# Patient Record
Sex: Male | Born: 1960
Health system: Southern US, Community
[De-identification: ages and names within clinical notes are randomized; demographics above are authoritative.]

## PROBLEM LIST (undated history)

## (undated) DIAGNOSIS — M810 Age-related osteoporosis without current pathological fracture: Secondary | ICD-10-CM

## (undated) DIAGNOSIS — E039 Hypothyroidism, unspecified: Secondary | ICD-10-CM

## (undated) DIAGNOSIS — E349 Endocrine disorder, unspecified: Secondary | ICD-10-CM

## (undated) DIAGNOSIS — E785 Hyperlipidemia, unspecified: Secondary | ICD-10-CM

## (undated) DIAGNOSIS — Z8639 Personal history of other endocrine, nutritional and metabolic disease: Secondary | ICD-10-CM

## (undated) DIAGNOSIS — E079 Disorder of thyroid, unspecified: Secondary | ICD-10-CM

## (undated) HISTORY — DX: Personal history of other endocrine, nutritional and metabolic disease: Z86.39

## (undated) HISTORY — PX: TONSILLECTOMY: SUR1361

## (undated) HISTORY — DX: Hyperlipidemia, unspecified: E78.5

## (undated) HISTORY — DX: Disorder of thyroid, unspecified: E07.9

## (undated) HISTORY — DX: Endocrine disorder, unspecified: E34.9

## (undated) HISTORY — DX: Age-related osteoporosis without current pathological fracture: M81.0

---

## 2001-11-01 ENCOUNTER — Ambulatory Visit (HOSPITAL_COMMUNITY): Admission: RE | Admit: 2001-11-01 | Discharge: 2001-11-01 | Payer: Self-pay | Admitting: Family Medicine

## 2001-11-01 ENCOUNTER — Encounter: Payer: Self-pay | Admitting: Family Medicine

## 2006-07-03 ENCOUNTER — Ambulatory Visit (HOSPITAL_COMMUNITY): Admission: RE | Admit: 2006-07-03 | Discharge: 2006-07-03 | Payer: Self-pay | Admitting: General Surgery

## 2006-07-03 LAB — HM COLONOSCOPY

## 2010-05-06 ENCOUNTER — Ambulatory Visit (HOSPITAL_COMMUNITY): Admission: RE | Admit: 2010-05-06 | Discharge: 2010-05-06 | Payer: Self-pay | Admitting: Family Medicine

## 2010-05-17 ENCOUNTER — Ambulatory Visit (HOSPITAL_COMMUNITY): Admission: RE | Admit: 2010-05-17 | Discharge: 2010-05-17 | Payer: Self-pay | Admitting: Family Medicine

## 2011-03-10 NOTE — H&P (Signed)
NAME:  Mason Andersen, Mason Andersen                ACCOUNT NO.:  [id number]   MEDICAL RECORD NO.:  [id number]            PATIENT TYPE:  AMB   LOCATION:                                FACILITY:  [facility]   PHYSICIAN:  Dalia Heading, M.D.  DATE OF BIRTH:  1960-12-08   DATE OF ADMISSION:  06/28/2006  DATE OF DISCHARGE:  LH                                HISTORY & PHYSICAL   CHIEF COMPLAINT:  Hematochezia.   HISTORY OF PRESENT ILLNESS:  The patient is a 50 year old white male who is  referred for endoscopic evaluation.  He needs colonoscopy for hematochezia.  He has been having intermittent hematochezia for many months.  He is  currently asymptomatic.  No abdominal pain, weight loss, nausea, vomiting,  diarrhea, constipation, or melena have been noted.  He has never had a  colonoscopy.  There is no family history of colon carcinoma.   PAST MEDICAL HISTORY:  Unremarkable.   PAST SURGICAL HISTORY:  Unremarkable.   CURRENT MEDICATIONS:  None.   ALLERGIES:  No known drug allergies.   REVIEW OF SYSTEMS:  Noncontributory.   PHYSICAL EXAMINATION:  GENERAL:  The patient is a well-developed, well-  nourished white male in no acute distress.  LUNGS:  Clear to auscultation with equal breath sounds bilaterally.  HEART:  Examination reveals a regular rate and rhythm without S3, S4, or  murmurs.  ABDOMEN:  Soft, nontender, and nondistended.  No hepatosplenomegaly or  masses are noted.  RECTAL:  Examination was deferred to the procedure.   IMPRESSION:  Hematochezia.   PLAN:  The patient is scheduled for a colonoscopy on June 28, 2006.  The  risks and benefits of the procedure including bleeding and perforation were  fully explained to the patient, who gave informed consent.      Dalia Heading, M.D.  Electronically Signed    MAJ/MEDQ  D:  06/14/2006  T:  06/14/2006  Job:  [id number]

## 2012-11-11 ENCOUNTER — Other Ambulatory Visit: Payer: Self-pay | Admitting: Family Medicine

## 2012-11-11 DIAGNOSIS — M810 Age-related osteoporosis without current pathological fracture: Secondary | ICD-10-CM

## 2012-11-14 ENCOUNTER — Ambulatory Visit (HOSPITAL_COMMUNITY)
Admission: RE | Admit: 2012-11-14 | Discharge: 2012-11-14 | Disposition: A | Discharge status: Home / Self Care | Payer: BC Managed Care – PPO | Source: Ambulatory Visit | Attending: Family Medicine | Admitting: Family Medicine

## 2012-11-14 DIAGNOSIS — M810 Age-related osteoporosis without current pathological fracture: Secondary | ICD-10-CM

## 2013-07-17 ENCOUNTER — Other Ambulatory Visit: Payer: Self-pay | Admitting: *Deleted

## 2013-09-15 ENCOUNTER — Other Ambulatory Visit: Payer: Self-pay | Admitting: Family Medicine

## 2013-09-15 DIAGNOSIS — R5381 Other malaise: Secondary | ICD-10-CM

## 2013-09-15 DIAGNOSIS — Z125 Encounter for screening for malignant neoplasm of prostate: Secondary | ICD-10-CM

## 2013-09-15 DIAGNOSIS — Z0000 Encounter for general adult medical examination without abnormal findings: Secondary | ICD-10-CM

## 2013-09-16 ENCOUNTER — Other Ambulatory Visit: Payer: Self-pay | Admitting: *Deleted

## 2013-09-16 DIAGNOSIS — Z125 Encounter for screening for malignant neoplasm of prostate: Secondary | ICD-10-CM

## 2013-09-16 DIAGNOSIS — Z0000 Encounter for general adult medical examination without abnormal findings: Secondary | ICD-10-CM

## 2013-09-16 DIAGNOSIS — R5381 Other malaise: Secondary | ICD-10-CM

## 2013-09-16 DIAGNOSIS — Z79899 Other long term (current) drug therapy: Secondary | ICD-10-CM

## 2013-09-16 NOTE — Telephone Encounter (Signed)
Not seen since epic

## 2013-09-16 NOTE — Telephone Encounter (Signed)
Ok times one liv met 7 lip psa and testosterone and ov before next rx

## 2013-09-16 NOTE — Telephone Encounter (Signed)
i need chart

## 2013-10-15 ENCOUNTER — Other Ambulatory Visit: Payer: Self-pay | Admitting: Family Medicine

## 2013-10-27 ENCOUNTER — Telehealth: Payer: Self-pay | Admitting: Family Medicine

## 2013-10-27 DIAGNOSIS — M25572 Pain in left ankle and joints of left foot: Secondary | ICD-10-CM

## 2013-10-27 NOTE — Telephone Encounter (Signed)
Patient notified via VM

## 2013-10-27 NOTE — Telephone Encounter (Signed)
Ok do x ray

## 2013-10-27 NOTE — Telephone Encounter (Signed)
Patient would like an xray ordered for his left heel because it has been in pain lately. He is hoping to be able to discuss this with you at this physical coming up.

## 2013-11-03 ENCOUNTER — Ambulatory Visit (HOSPITAL_COMMUNITY)
Admission: RE | Admit: 2013-11-03 | Discharge: 2013-11-03 | Disposition: A | Discharge status: Home / Self Care | Payer: BC Managed Care – PPO | Source: Ambulatory Visit | Attending: Family Medicine | Admitting: Family Medicine

## 2013-11-03 DIAGNOSIS — M79609 Pain in unspecified limb: Secondary | ICD-10-CM | POA: Insufficient documentation

## 2013-11-03 DIAGNOSIS — M25572 Pain in left ankle and joints of left foot: Secondary | ICD-10-CM

## 2013-11-03 LAB — BASIC METABOLIC PANEL
BUN: 16 mg/dL (ref 6–23)
CO2: 30 mEq/L (ref 19–32)
Calcium: 8.8 mg/dL (ref 8.4–10.5)
Chloride: 100 mEq/L (ref 96–112)
Creat: 0.93 mg/dL (ref 0.50–1.35)
Glucose, Bld: 95 mg/dL (ref 70–99)
Potassium: 4.6 mEq/L (ref 3.5–5.3)
Sodium: 139 mEq/L (ref 135–145)

## 2013-11-03 LAB — LIPID PANEL
Cholesterol: 182 mg/dL (ref 0–200)
HDL: 42 mg/dL (ref >39)
LDL Cholesterol: 108 mg/dL — ABNORMAL HIGH (ref 0–99)
Total CHOL/HDL Ratio: 4.3 Ratio
Triglycerides: 161 mg/dL — ABNORMAL HIGH (ref <150)
VLDL: 32 mg/dL (ref 0–40)

## 2013-11-03 LAB — HEPATIC FUNCTION PANEL
ALT: 70 U/L — ABNORMAL HIGH (ref 0–53)
AST: 21 U/L (ref 0–37)
Albumin: 4.1 g/dL (ref 3.5–5.2)
Alkaline Phosphatase: 83 U/L (ref 39–117)
Bilirubin, Direct: 0.1 mg/dL (ref 0.0–0.3)
Indirect Bilirubin: 0.4 mg/dL (ref 0.0–0.9)
Total Bilirubin: 0.5 mg/dL (ref 0.3–1.2)
Total Protein: 6.8 g/dL (ref 6.0–8.3)

## 2013-11-03 LAB — PSA: PSA: 0.7 ng/mL (ref <=4.00)

## 2013-11-04 LAB — TESTOSTERONE: Testosterone: 208 ng/dL — ABNORMAL LOW (ref 300–890)

## 2013-11-14 ENCOUNTER — Encounter: Payer: Self-pay | Admitting: Family Medicine

## 2013-11-14 ENCOUNTER — Ambulatory Visit (INDEPENDENT_AMBULATORY_CARE_PROVIDER_SITE_OTHER): Payer: BC Managed Care – PPO | Admitting: Family Medicine

## 2013-11-14 VITALS — BP 138/90 | HR 80 | Ht 71.25 in | Wt 216.0 lb

## 2013-11-14 DIAGNOSIS — R5381 Other malaise: Secondary | ICD-10-CM

## 2013-11-14 DIAGNOSIS — Z23 Encounter for immunization: Secondary | ICD-10-CM

## 2013-11-14 DIAGNOSIS — R748 Abnormal levels of other serum enzymes: Secondary | ICD-10-CM

## 2013-11-14 DIAGNOSIS — E039 Hypothyroidism, unspecified: Secondary | ICD-10-CM

## 2013-11-14 DIAGNOSIS — M810 Age-related osteoporosis without current pathological fracture: Secondary | ICD-10-CM

## 2013-11-14 DIAGNOSIS — R7989 Other specified abnormal findings of blood chemistry: Secondary | ICD-10-CM

## 2013-11-14 DIAGNOSIS — R5383 Other fatigue: Secondary | ICD-10-CM

## 2013-11-14 DIAGNOSIS — E291 Testicular hypofunction: Secondary | ICD-10-CM

## 2013-11-14 DIAGNOSIS — Z0000 Encounter for general adult medical examination without abnormal findings: Secondary | ICD-10-CM

## 2013-11-14 MED ORDER — TESTOSTERONE 50 MG/5GM (1%) TD GEL: TRANSDERMAL | Status: DC

## 2013-11-14 MED ORDER — LEVOTHYROXINE SODIUM 75 MCG PO TABS: 75.0000 ug | ORAL_TABLET | Freq: Every day | ORAL | Status: DC

## 2013-11-14 MED ORDER — ALENDRONATE SODIUM 70 MG PO TABS: 70.0000 mg | ORAL_TABLET | ORAL | Status: DC

## 2013-11-14 NOTE — Progress Notes (Signed)
Subjective:    Patient ID: Mason Andersen, male    DOB: 1960/12/16, 53 y.o.   MRN: [id number]  HPIPhysical.   Follow up on left foot xray. Notes progressive foot pain over the past 6 months.   Also appear for complete wellness exam.  On further history reports fair compliance and diet. Trying the right pain no does not or 60.  Was exercising regularly last fall has fell off this winter.  Recently turned 52 had a colonoscopy number of years ago. He cannot remember when. He cannot remember he did a colonoscopy.  Patient has history of osteoporosis hypothyroidism and low testosterone. He is on medications for all. Results for orders placed in visit on 09/16/13  BASIC METABOLIC PANEL      Result Value Range   Sodium 139  135 - 145 mEq/L   Potassium 4.6  3.5 - 5.3 mEq/L   Chloride 100  96 - 112 mEq/L   CO2 30  19 - 32 mEq/L   Glucose, Bld 95  70 - 99 mg/dL   BUN 16  6 - 23 mg/dL   Creat 2.130.93  0.860.50 - 5.781.35 mg/dL   Calcium 8.8  8.4 - 46.910.5 mg/dL  LIPID PANEL      Result Value Range   Cholesterol 182  0 - 200 mg/dL   Triglycerides 629161 (*) <150 mg/dL   HDL 42  >52>39 mg/dL   Total CHOL/HDL Ratio 4.3     VLDL 32  0 - 40 mg/dL   LDL Cholesterol 841108 (*) 0 - 99 mg/dL  HEPATIC FUNCTION PANEL      Result Value Range   Total Bilirubin 0.5  0.3 - 1.2 mg/dL   Bilirubin, Direct 0.1  0.0 - 0.3 mg/dL   Indirect Bilirubin 0.4  0.0 - 0.9 mg/dL   Alkaline Phosphatase 83  39 - 117 U/L   AST 21  0 - 37 U/L   ALT 70 (*) 0 - 53 U/L   Total Protein 6.8  6.0 - 8.3 g/dL   Albumin 4.1  3.5 - 5.2 g/dL  PSA      Result Value Range   PSA 0.70  <=4.00 ng/mL  TESTOSTERONE      Result Value Range   Testosterone 208 (*) 300 - 890 ng/dL     Requesting tetanus vaccine.  Not a flu shot guy, Pneum injec discussed and encouraged   Review of Systems  Constitutional: Negative for fever, activity change and appetite change.  HENT: Negative for congestion and rhinorrhea.   Eyes: Negative for discharge.    Respiratory: Negative for cough and wheezing.   Cardiovascular: Negative for chest pain.  Gastrointestinal: Negative for vomiting, abdominal pain and blood in stool.  Genitourinary: Negative for frequency and difficulty urinating.  Musculoskeletal: Negative for neck pain.  Skin: Negative for rash.  Allergic/Immunologic: Negative for environmental allergies and food allergies.  Neurological: Negative for weakness and headaches.  Psychiatric/Behavioral: Negative for agitation.       Objective:   Physical Exam  Vitals reviewed. Constitutional: He appears well-developed and well-nourished.  HENT:  Head: Normocephalic and atraumatic.  Right Ear: External ear normal.  Left Ear: External ear normal.  Nose: Nose normal.  Mouth/Throat: Oropharynx is clear and moist.  Eyes: EOM are normal. Pupils are equal, round, and reactive to light.  Neck: Normal range of motion. Neck supple. No thyromegaly present.  Cardiovascular: Normal rate, regular rhythm and normal heart sounds.   No murmur heard. Pulmonary/Chest: Effort normal  and breath sounds normal. No respiratory distress. He has no wheezes.  Abdominal: Soft. Bowel sounds are normal. He exhibits no distension and no mass. There is no tenderness.  Genitourinary: Penis normal.  Musculoskeletal: Normal range of motion. He exhibits no edema.  Lymphadenopathy:    He has no cervical adenopathy.  Neurological: He is alert. He exhibits normal muscle tone.  Skin: Skin is warm and dry. No erythema.  Psychiatric: He has a normal mood and affect. His behavior is normal. Judgment normal.   Prostate within normal limits       Assessment & Plan:   impression 1 wellness exam vaccines discussed at length. Patient only wants to do tetanus shot at this time. #2 progressive foot pain. X-ray negative we'll discuss more at disease or in an in visit in several weeks. #3 hypothyroidism #4 low testosterone. #5 osteoporosis plan recheck in several weeks for  disease management. We'll followup on colonoscopy. Diet exercise discussed in encourage.

## 2013-11-16 DIAGNOSIS — M810 Age-related osteoporosis without current pathological fracture: Secondary | ICD-10-CM | POA: Insufficient documentation

## 2013-11-16 DIAGNOSIS — R7989 Other specified abnormal findings of blood chemistry: Secondary | ICD-10-CM | POA: Insufficient documentation

## 2013-11-16 DIAGNOSIS — E039 Hypothyroidism, unspecified: Secondary | ICD-10-CM | POA: Insufficient documentation

## 2013-11-21 ENCOUNTER — Other Ambulatory Visit: Payer: Self-pay | Admitting: Family Medicine

## 2013-11-24 NOTE — Telephone Encounter (Signed)
Ok times one  f u visit already sched for this and other issues

## 2013-11-28 LAB — TSH: TSH: 2.872 u[IU]/mL (ref 0.350–4.500)

## 2013-11-28 LAB — HEPATIC FUNCTION PANEL
ALBUMIN: 4.2 g/dL (ref 3.5–5.2)
ALT: 80 U/L — AB (ref 0–53)
AST: 43 U/L — ABNORMAL HIGH (ref 0–37)
Alkaline Phosphatase: 59 U/L (ref 39–117)
BILIRUBIN TOTAL: 0.7 mg/dL (ref 0.2–1.2)
Bilirubin, Direct: 0.1 mg/dL (ref 0.0–0.3)
Indirect Bilirubin: 0.6 mg/dL (ref 0.2–1.2)
Total Protein: 6.8 g/dL (ref 6.0–8.3)

## 2013-11-29 LAB — VITAMIN D 25 HYDROXY (VIT D DEFICIENCY, FRACTURES): VIT D 25 HYDROXY: 43 ng/mL (ref 30–89)

## 2013-12-05 ENCOUNTER — Ambulatory Visit (INDEPENDENT_AMBULATORY_CARE_PROVIDER_SITE_OTHER): Payer: BC Managed Care – PPO | Admitting: Family Medicine

## 2013-12-05 ENCOUNTER — Encounter: Payer: Self-pay | Admitting: Family Medicine

## 2013-12-05 VITALS — BP 110/80 | Ht 71.25 in | Wt 213.2 lb

## 2013-12-05 DIAGNOSIS — E291 Testicular hypofunction: Secondary | ICD-10-CM

## 2013-12-05 DIAGNOSIS — E039 Hypothyroidism, unspecified: Secondary | ICD-10-CM

## 2013-12-05 DIAGNOSIS — R7989 Other specified abnormal findings of blood chemistry: Secondary | ICD-10-CM

## 2013-12-05 DIAGNOSIS — M810 Age-related osteoporosis without current pathological fracture: Secondary | ICD-10-CM

## 2013-12-05 MED ORDER — DICLOFENAC SODIUM 75 MG PO TBEC: 75.0000 mg | DELAYED_RELEASE_TABLET | Freq: Two times a day (BID) | ORAL | Status: DC | PRN

## 2013-12-05 NOTE — Progress Notes (Signed)
   Subjective:    Patient ID: Mason Andersen, male    DOB: Sep 09, 1961, 53 y.o.   MRN: [id number]  HPI Patient is here today for a follow up visit on osteoporosis. Patient actually has several concerns. He claims compliance with the Fosamax. No obvious side effects from it. Recently had bone density test which revealed improved strength the bones. Vitamin D also strong History of low thyroid. Compliant with medications. Recent TSH within good limits. No symptoms of high or low thyroid.  On testosterone supplement. Very mild elevation of liver enzymes. This can accompany this. Discussed with patient.  Significant heel pain. Results for orders placed in visit on 11/14/13  HEPATIC FUNCTION PANEL      Result Value Ref Range   Total Bilirubin 0.7  0.2 - 1.2 mg/dL   Bilirubin, Direct 0.1  0.0 - 0.3 mg/dL   Indirect Bilirubin 0.6  0.2 - 1.2 mg/dL   Alkaline Phosphatase 59  39 - 117 U/L   AST 43 (*) 0 - 37 U/L   ALT 80 (*) 0 - 53 U/L   Total Protein 6.8  6.0 - 8.3 g/dL   Albumin 4.2  3.5 - 5.2 g/dL  TSH      Result Value Ref Range   TSH 2.872  0.350 - 4.500 uIU/mL  VITAMIN D 25 HYDROXY      Result Value Ref Range   Vit D, 25-Hydroxy 43  30 - 89 ng/mL    Patient states that he is also here to follow up on his left heel pain. Xrays was done at last visit and they came back negative but he is still having pain in the area. . Worse when he first gets up in the morning. Works on a hard concrete surface.   Review of Systems No chest pain no back pain no abdominal pain no joint pain elsewhere no weight loss weight gain ROS otherwise negative    Objective:   Physical Exam  Alert no apparent distress HEENT normal neck supple no lymphadenopathy no palpable thyroid lungs clear. Heart regular rate and rhythm. Foot exam reveals tender heel. Pulses good sensation good.      Assessment & Plan:  In impression 1 plantar fascitis discussed at length #2 osteoporosis clinically stable discussed #3  hypothyroidism clinically stable good control discussed. #4 low testosterone discussed. May play into slight elevation of liver enzymes with supplementation. Plan diet exercise discussed in encourage appropriate exercises for left foot discussed. Appropriate footwear discussed. Maintain same medications. Check every 6 months. WSL

## 2013-12-13 ENCOUNTER — Encounter: Payer: Self-pay | Admitting: *Deleted

## 2014-01-17 ENCOUNTER — Other Ambulatory Visit: Payer: Self-pay | Admitting: Family Medicine

## 2014-01-19 NOTE — Telephone Encounter (Signed)
Ok times 6 mo

## 2014-01-19 NOTE — Telephone Encounter (Signed)
Last seen 12/05/13

## 2014-01-26 ENCOUNTER — Other Ambulatory Visit: Payer: Self-pay | Admitting: Family Medicine

## 2014-01-26 NOTE — Telephone Encounter (Signed)
Ok plus five monthly ref 

## 2014-05-05 ENCOUNTER — Other Ambulatory Visit: Payer: Self-pay | Admitting: Family Medicine

## 2014-05-25 ENCOUNTER — Other Ambulatory Visit: Payer: Self-pay | Admitting: Family Medicine

## 2014-06-03 ENCOUNTER — Other Ambulatory Visit: Payer: Self-pay | Admitting: *Deleted

## 2014-06-03 MED ORDER — ALENDRONATE SODIUM 70 MG PO TABS: 70.0000 mg | ORAL_TABLET | ORAL | Status: DC

## 2014-08-10 ENCOUNTER — Other Ambulatory Visit: Payer: Self-pay | Admitting: Family Medicine

## 2014-08-10 DIAGNOSIS — Z7901 Long term (current) use of anticoagulants: Secondary | ICD-10-CM

## 2014-08-11 NOTE — Telephone Encounter (Signed)
Last seen 12/05/13

## 2014-08-12 ENCOUNTER — Other Ambulatory Visit: Payer: Self-pay | Admitting: *Deleted

## 2014-08-12 DIAGNOSIS — Z7901 Long term (current) use of anticoagulants: Secondary | ICD-10-CM

## 2014-08-12 NOTE — Telephone Encounter (Signed)
One mo worth, needs liv zymes test level and ov

## 2014-08-17 ENCOUNTER — Other Ambulatory Visit: Payer: Self-pay | Admitting: Family Medicine

## 2014-08-25 ENCOUNTER — Other Ambulatory Visit: Payer: Self-pay | Admitting: *Deleted

## 2014-08-25 MED ORDER — TESTOSTERONE 50 MG/5GM (1%) TD GEL: TRANSDERMAL | Status: DC

## 2014-08-25 NOTE — Telephone Encounter (Signed)
Ok plus three ref 

## 2014-08-25 NOTE — Telephone Encounter (Signed)
Last seen 12/05/13

## 2014-09-01 ENCOUNTER — Ambulatory Visit: Payer: BC Managed Care – PPO | Admitting: Family Medicine

## 2014-11-10 ENCOUNTER — Telehealth: Payer: Self-pay | Admitting: Family Medicine

## 2014-11-10 DIAGNOSIS — Z13220 Encounter for screening for lipoid disorders: Secondary | ICD-10-CM

## 2014-11-10 DIAGNOSIS — Z79899 Other long term (current) drug therapy: Secondary | ICD-10-CM

## 2014-11-10 DIAGNOSIS — Z125 Encounter for screening for malignant neoplasm of prostate: Secondary | ICD-10-CM

## 2014-11-10 DIAGNOSIS — E039 Hypothyroidism, unspecified: Secondary | ICD-10-CM

## 2014-11-10 DIAGNOSIS — R7989 Other specified abnormal findings of blood chemistry: Secondary | ICD-10-CM

## 2014-11-10 NOTE — Telephone Encounter (Signed)
bw orders put in. Pt notified.  

## 2014-11-10 NOTE — Telephone Encounter (Signed)
Lip liv m7 tsh psa testosterone

## 2014-11-10 NOTE — Telephone Encounter (Signed)
Pt has appt 2/1 wants to know if he needs labs?   Please call him when ready   Last labs Vit D, Hep Func, TSH  11/28/13

## 2014-11-16 ENCOUNTER — Encounter: Payer: Self-pay | Admitting: Family Medicine

## 2014-11-16 ENCOUNTER — Ambulatory Visit (INDEPENDENT_AMBULATORY_CARE_PROVIDER_SITE_OTHER): Payer: 59 | Admitting: Family Medicine

## 2014-11-16 VITALS — BP 130/88 | Ht 71.25 in | Wt 223.0 lb

## 2014-11-16 DIAGNOSIS — S300XXA Contusion of lower back and pelvis, initial encounter: Secondary | ICD-10-CM

## 2014-11-16 LAB — HEPATIC FUNCTION PANEL
ALBUMIN: 4 g/dL (ref 3.5–5.2)
ALT: 50 U/L (ref 0–53)
AST: 44 U/L — AB (ref 0–37)
Alkaline Phosphatase: 48 U/L (ref 39–117)
BILIRUBIN DIRECT: 0.1 mg/dL (ref 0.0–0.3)
BILIRUBIN TOTAL: 0.6 mg/dL (ref 0.2–1.2)
Indirect Bilirubin: 0.5 mg/dL (ref 0.2–1.2)
Total Protein: 6.5 g/dL (ref 6.0–8.3)

## 2014-11-16 LAB — LIPID PANEL
CHOL/HDL RATIO: 5.2 ratio
Cholesterol: 223 mg/dL — ABNORMAL HIGH (ref 0–200)
HDL: 43 mg/dL (ref >39)
LDL Cholesterol: 149 mg/dL — ABNORMAL HIGH (ref 0–99)
TRIGLYCERIDES: 153 mg/dL — AB (ref <150)
VLDL: 31 mg/dL (ref 0–40)

## 2014-11-16 LAB — BASIC METABOLIC PANEL
BUN: 19 mg/dL (ref 6–23)
CO2: 27 mEq/L (ref 19–32)
CREATININE: 1.08 mg/dL (ref 0.50–1.35)
Calcium: 9.2 mg/dL (ref 8.4–10.5)
Chloride: 105 mEq/L (ref 96–112)
Glucose, Bld: 96 mg/dL (ref 70–99)
Potassium: 4.6 mEq/L (ref 3.5–5.3)
Sodium: 139 mEq/L (ref 135–145)

## 2014-11-16 LAB — TESTOSTERONE: Testosterone: 306 ng/dL (ref 300–890)

## 2014-11-16 LAB — TSH: TSH: 5.594 u[IU]/mL — AB (ref 0.350–4.500)

## 2014-11-16 MED ORDER — KETOCONAZOLE 2 % EX CREA: 1.0000 "application " | TOPICAL_CREAM | Freq: Two times a day (BID) | CUTANEOUS | Status: DC

## 2014-11-16 MED ORDER — HYDROCODONE-ACETAMINOPHEN 5-325 MG PO TABS: 1.0000 | ORAL_TABLET | Freq: Four times a day (QID) | ORAL | Status: DC | PRN
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
## 2014-11-17 LAB — PSA: PSA: 0.72 ng/mL (ref <=4.00)

## 2014-11-23 ENCOUNTER — Encounter: Payer: Self-pay | Admitting: Family Medicine

## 2014-11-23 ENCOUNTER — Ambulatory Visit (INDEPENDENT_AMBULATORY_CARE_PROVIDER_SITE_OTHER): Payer: 59 | Admitting: Family Medicine

## 2014-11-23 VITALS — BP 122/84 | Ht 71.25 in | Wt 219.0 lb

## 2014-11-23 DIAGNOSIS — E291 Testicular hypofunction: Secondary | ICD-10-CM

## 2014-11-23 DIAGNOSIS — Z0000 Encounter for general adult medical examination without abnormal findings: Secondary | ICD-10-CM

## 2014-11-23 DIAGNOSIS — E039 Hypothyroidism, unspecified: Secondary | ICD-10-CM

## 2014-11-23 DIAGNOSIS — R7989 Other specified abnormal findings of blood chemistry: Secondary | ICD-10-CM

## 2014-11-23 MED ORDER — ALENDRONATE SODIUM 70 MG PO TABS: 70.0000 mg | ORAL_TABLET | ORAL | Status: DC

## 2014-11-23 MED ORDER — LEVOTHYROXINE SODIUM 88 MCG PO TABS: 88.0000 ug | ORAL_TABLET | Freq: Every day | ORAL | Status: DC

## 2014-11-23 MED ORDER — TESTOSTERONE 50 MG/5GM (1%) TD GEL: TRANSDERMAL | Status: DC
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
## 2014-11-24 ENCOUNTER — Other Ambulatory Visit: Payer: Self-pay | Admitting: Family Medicine

## 2014-11-26 ENCOUNTER — Telehealth: Payer: Self-pay | Admitting: Family Medicine

## 2014-11-26 NOTE — Telephone Encounter (Signed)
Patient went to pick up testosterone (androgel 50 mg/5 mg ) and his insurance doesn't cover it.He wants to know is their something close to this that his insurance will cover. [facility]

## 2014-11-26 NOTE — Telephone Encounter (Signed)
Pt's insurance will cover cypionate.

## 2014-11-26 NOTE — Telephone Encounter (Signed)
Pt is going to call us back with what his insurance covers.

## 2014-12-01 NOTE — Telephone Encounter (Signed)
Does [facility] need to try to do prior auth or appeal or do you rec changing to other med

## 2014-12-02 NOTE — Telephone Encounter (Signed)
Patient wants to call his insurance back and see if there is any form of cream testosterone covered bu his insurance before proceeding with urology referral.

## 2014-12-02 NOTE — Telephone Encounter (Signed)
Patient calling to check on testosterone

## 2014-12-02 NOTE — Telephone Encounter (Signed)
Ntsw, rules have just been changed on cyprionate to make it highly controlled and we no longer give here at the office, if insur only covers cipr we will need to refer him to urologist wheere he can get this

## 2014-12-10 ENCOUNTER — Telehealth: Payer: Self-pay | Admitting: Family Medicine

## 2014-12-10 MED ORDER — TESTOSTERONE 20.25 MG/ACT (1.62%) TD GEL: TRANSDERMAL | Status: DC

## 2014-12-10 NOTE — Telephone Encounter (Signed)
Pt's testosterone (ANDROGEL) 50 MG/5GM (1%) GEL was DENIED, please see denial letter in green folder and copy of formulary, please advise  (pt called yesterday to check on this but at that time I didn't have denial letter)

## 2014-12-10 NOTE — Telephone Encounter (Signed)
Pt.notified

## 2014-12-10 NOTE — Telephone Encounter (Signed)
androgel 1.62% three pumps daily, which is slightly higher dose than curren, 6 mo ref

## 2015-05-06 ENCOUNTER — Other Ambulatory Visit: Payer: Self-pay | Admitting: Family Medicine

## 2015-05-24 ENCOUNTER — Ambulatory Visit (INDEPENDENT_AMBULATORY_CARE_PROVIDER_SITE_OTHER): Payer: 59 | Admitting: Family Medicine

## 2015-05-24 ENCOUNTER — Encounter: Payer: Self-pay | Admitting: Family Medicine

## 2015-05-24 VITALS — BP 118/78 | Ht 71.0 in | Wt 209.0 lb

## 2015-05-24 DIAGNOSIS — Z79899 Other long term (current) drug therapy: Secondary | ICD-10-CM

## 2015-05-24 DIAGNOSIS — E039 Hypothyroidism, unspecified: Secondary | ICD-10-CM | POA: Diagnosis not present

## 2015-05-24 DIAGNOSIS — M810 Age-related osteoporosis without current pathological fracture: Secondary | ICD-10-CM

## 2015-05-24 DIAGNOSIS — E291 Testicular hypofunction: Secondary | ICD-10-CM

## 2015-05-24 DIAGNOSIS — R7989 Other specified abnormal findings of blood chemistry: Secondary | ICD-10-CM

## 2015-05-24 MED ORDER — TESTOSTERONE 20.25 MG/ACT (1.62%) TD GEL: TRANSDERMAL | Status: DC

## 2015-05-24 MED ORDER — ALENDRONATE SODIUM 70 MG PO TABS: ORAL_TABLET | ORAL | Status: DC

## 2015-05-24 MED ORDER — LEVOTHYROXINE SODIUM 88 MCG PO TABS: 88.0000 ug | ORAL_TABLET | Freq: Every day | ORAL | Status: DC

## 2015-05-24 NOTE — Progress Notes (Signed)
   Subjective:    Patient ID: Mason Andersen, male    DOB: 31-Jan-1961, 54 y.o.   MRN: [id number]  HPIpt arrives today for a check up. Pt states no concerns or problems today. Needs refills on all meds.   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 We did adjust his medications. No symptoms of hyper or hypo-. Compliant with meds.  On testosterone supplement. States overall energy level good. Does not miss a dose. Is using a new form of testosterone.  History of osteoporosis. Has been on Fosamax now for 5 years. No bone density test for 2 and half years Review of Systems no chest pain no headache no back pain no abdominal pain no change in bowel habits no fatigue    Objective:   Physical Exam  alert vitals stable blood pressure good on repeat HEENT normal. Lungs clear. Heart regular in rhythm. Ankles without edema        Assessment & Plan:   impression 1 osteoporosis discussed time for reevaluation. If continues to improve may pull off medicine for a while #2 low testosterone status uncertain #3 hypothyroidism status uncertain plan appropriate blood work. Bone density test. Diet exercise discussed recheck in 6 months WSL

## 2015-05-25 LAB — TESTOSTERONE: TESTOSTERONE: 214 ng/dL — AB (ref 348–1197)

## 2015-05-25 LAB — HEPATIC FUNCTION PANEL
ALT: 46 IU/L — ABNORMAL HIGH (ref 0–44)
AST: 22 IU/L (ref 0–40)
Albumin: 4.3 g/dL (ref 3.5–5.5)
Alkaline Phosphatase: 65 IU/L (ref 39–117)
Bilirubin Total: 0.7 mg/dL (ref 0.0–1.2)
Bilirubin, Direct: 0.13 mg/dL (ref 0.00–0.40)
Total Protein: 7 g/dL (ref 6.0–8.5)

## 2015-05-25 LAB — TSH: TSH: 1.42 u[IU]/mL (ref 0.450–4.500)

## 2015-05-28 ENCOUNTER — Ambulatory Visit (HOSPITAL_COMMUNITY)
Admission: RE | Admit: 2015-05-28 | Discharge: 2015-05-28 | Disposition: A | Discharge status: Home / Self Care | Payer: 59 | Source: Ambulatory Visit | Attending: Family Medicine | Admitting: Family Medicine

## 2015-05-28 DIAGNOSIS — M810 Age-related osteoporosis without current pathological fracture: Secondary | ICD-10-CM | POA: Diagnosis present

## 2015-05-28 MED ORDER — TESTOSTERONE 20.25 MG/ACT (1.62%) TD GEL: TRANSDERMAL | Status: DC

## 2015-05-28 NOTE — Addendum Note (Signed)
Addended by: Margaretha Sheffield on: 05/28/2015 09:46 AM   Modules accepted: Orders

## 2015-11-26 ENCOUNTER — Encounter: Payer: Self-pay | Admitting: Family Medicine

## 2015-11-26 ENCOUNTER — Ambulatory Visit (INDEPENDENT_AMBULATORY_CARE_PROVIDER_SITE_OTHER): Payer: 59 | Admitting: Family Medicine

## 2015-11-26 VITALS — BP 124/82 | Ht 71.0 in | Wt 211.5 lb

## 2015-11-26 DIAGNOSIS — E291 Testicular hypofunction: Secondary | ICD-10-CM

## 2015-11-26 DIAGNOSIS — Z139 Encounter for screening, unspecified: Secondary | ICD-10-CM | POA: Diagnosis not present

## 2015-11-26 DIAGNOSIS — E039 Hypothyroidism, unspecified: Secondary | ICD-10-CM

## 2015-11-26 DIAGNOSIS — R7989 Other specified abnormal findings of blood chemistry: Secondary | ICD-10-CM

## 2015-11-26 DIAGNOSIS — Z0000 Encounter for general adult medical examination without abnormal findings: Secondary | ICD-10-CM

## 2015-11-26 MED ORDER — LEVOTHYROXINE SODIUM 88 MCG PO TABS: 88.0000 ug | ORAL_TABLET | Freq: Every day | ORAL | Status: DC

## 2015-11-26 MED ORDER — TESTOSTERONE 20.25 MG/ACT (1.62%) TD GEL: TRANSDERMAL | Status: DC
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
## 2015-11-27 LAB — LIPID PANEL
Chol/HDL Ratio: 3.8 ratio units (ref 0.0–5.0)
Cholesterol, Total: 196 mg/dL (ref 100–199)
HDL: 52 mg/dL (ref >39)
LDL Calculated: 119 mg/dL — ABNORMAL HIGH (ref 0–99)
TRIGLYCERIDES: 127 mg/dL (ref 0–149)
VLDL Cholesterol Cal: 25 mg/dL (ref 5–40)

## 2015-11-27 LAB — BASIC METABOLIC PANEL
BUN/Creatinine Ratio: 17 (ref 9–20)
BUN: 17 mg/dL (ref 6–24)
CALCIUM: 8.9 mg/dL (ref 8.7–10.2)
CO2: 26 mmol/L (ref 18–29)
Chloride: 101 mmol/L (ref 96–106)
Creatinine, Ser: 1.03 mg/dL (ref 0.76–1.27)
GFR calc non Af Amer: 83 mL/min/{1.73_m2} (ref >59)
GFR, EST AFRICAN AMERICAN: 96 mL/min/{1.73_m2} (ref >59)
Glucose: 89 mg/dL (ref 65–99)
Potassium: 4.5 mmol/L (ref 3.5–5.2)
Sodium: 139 mmol/L (ref 134–144)

## 2015-11-27 LAB — PSA: Prostate Specific Ag, Serum: 1 ng/mL (ref 0.0–4.0)

## 2015-11-27 LAB — TESTOSTERONE: Testosterone: 603 ng/dL (ref 348–1197)

## 2015-11-27 LAB — TSH: TSH: 2.71 u[IU]/mL (ref 0.450–4.500)

## 2015-12-07 ENCOUNTER — Encounter: Payer: Self-pay | Admitting: Family Medicine

## 2015-12-16 ENCOUNTER — Telehealth: Payer: Self-pay

## 2015-12-16 NOTE — Telephone Encounter (Signed)
I called pt to triage for colonoscopy. He said he thought his last one was 10 years ago and he does not know why he had one at that time.  He is not sure who did it. I told him I will check with [facility] if I do not find it in the chart and get back to him. See triage info, he does drink alcohol about 3 times weekly and about 3-4 beers each time.

## 2015-12-16 NOTE — Telephone Encounter (Signed)
Pt's PCP told him to call to set up tcs. Please call [phone]

## 2015-12-16 NOTE — Telephone Encounter (Signed)
I have called and triaged. Checking on previous colonoscopy report.

## 2015-12-17 NOTE — Telephone Encounter (Signed)
Received the report from [facility].  Last colonoscoy was 07/03/2006 by Dr. Lovell Sheehan and the next recommended in 10 years.

## 2015-12-29 NOTE — Telephone Encounter (Signed)
PT wants to check his insurance and see if it will cover before the 10 years are up in Sept this year. He will call me back in a day or so.

## 2015-12-29 NOTE — Telephone Encounter (Signed)
LMOM for a return call for appt date and time.  I need to update the triage info I have at the time I schedule.

## 2016-01-06 NOTE — Telephone Encounter (Signed)
Mailed letter for pt to call.

## 2016-01-06 NOTE — Telephone Encounter (Signed)
See separate note. Letter mailed to pt today.

## 2016-05-11 ENCOUNTER — Encounter: Payer: Self-pay | Admitting: Family Medicine

## 2016-05-11 ENCOUNTER — Ambulatory Visit (INDEPENDENT_AMBULATORY_CARE_PROVIDER_SITE_OTHER): Payer: 59 | Admitting: Family Medicine

## 2016-05-11 VITALS — BP 136/84 | Temp 99.7°F | Ht 71.0 in | Wt 217.0 lb

## 2016-05-11 DIAGNOSIS — B9689 Other specified bacterial agents as the cause of diseases classified elsewhere: Secondary | ICD-10-CM

## 2016-05-11 DIAGNOSIS — J019 Acute sinusitis, unspecified: Secondary | ICD-10-CM

## 2016-05-11 MED ORDER — AMOXICILLIN-POT CLAVULANATE 875-125 MG PO TABS: 1.0000 | ORAL_TABLET | Freq: Two times a day (BID) | ORAL | Status: DC

## 2016-05-11 NOTE — Patient Instructions (Signed)
DASH Eating Plan  DASH stands for "Dietary Approaches to Stop Hypertension." The DASH eating plan is a healthy eating plan that has been shown to reduce high blood pressure (hypertension). Additional health benefits may include reducing the risk of type 2 diabetes mellitus, heart disease, and stroke. The DASH eating plan may also help with weight loss.  WHAT DO I NEED TO KNOW ABOUT THE DASH EATING PLAN?  For the DASH eating plan, you will follow these general guidelines:  · Choose foods with a percent daily value for sodium of less than 5% (as listed on the food label).  · Use salt-free seasonings or herbs instead of table salt or sea salt.  · Check with your health care provider or pharmacist before using salt substitutes.  · Eat lower-sodium products, often labeled as "lower sodium" or "no salt added."  · Eat fresh foods.  · Eat more vegetables, fruits, and low-fat dairy products.  · Choose whole grains. Look for the word "whole" as the first word in the ingredient list.  · Choose fish and skinless chicken or turkey more often than red meat. Limit fish, poultry, and meat to 6 oz (170 g) each day.  · Limit sweets, desserts, sugars, and sugary drinks.  · Choose heart-healthy fats.  · Limit cheese to 1 oz (28 g) per day.  · Eat more home-cooked food and less restaurant, buffet, and fast food.  · Limit fried foods.  · Cook foods using methods other than frying.  · Limit canned vegetables. If you do use them, rinse them well to decrease the sodium.  · When eating at a restaurant, ask that your food be prepared with less salt, or no salt if possible.  WHAT FOODS CAN I EAT?  Seek help from a dietitian for individual calorie needs.  Grains  Whole grain or whole wheat bread. Brown rice. Whole grain or whole wheat pasta. Quinoa, bulgur, and whole grain cereals. Low-sodium cereals. Corn or whole wheat flour tortillas. Whole grain cornbread. Whole grain crackers. Low-sodium crackers.  Vegetables  Fresh or frozen vegetables  (raw, steamed, roasted, or grilled). Low-sodium or reduced-sodium tomato and vegetable juices. Low-sodium or reduced-sodium tomato sauce and paste. Low-sodium or reduced-sodium canned vegetables.   Fruits  All fresh, canned (in natural juice), or frozen fruits.  Meat and Other Protein Products  Ground beef (85% or leaner), grass-fed beef, or beef trimmed of fat. Skinless chicken or turkey. Ground chicken or turkey. Pork trimmed of fat. All fish and seafood. Eggs. Dried beans, peas, or lentils. Unsalted nuts and seeds. Unsalted canned beans.  Dairy  Low-fat dairy products, such as skim or 1% milk, 2% or reduced-fat cheeses, low-fat ricotta or cottage cheese, or plain low-fat yogurt. Low-sodium or reduced-sodium cheeses.  Fats and Oils  Tub margarines without trans fats. Light or reduced-fat mayonnaise and salad dressings (reduced sodium). Avocado. Safflower, olive, or canola oils. Natural peanut or almond butter.  Other  Unsalted popcorn and pretzels.  The items listed above may not be a complete list of recommended foods or beverages. Contact your dietitian for more options.  WHAT FOODS ARE NOT RECOMMENDED?  Grains  White bread. White pasta. White rice. Refined cornbread. Bagels and croissants. Crackers that contain trans fat.  Vegetables  Creamed or fried vegetables. Vegetables in a cheese sauce. Regular canned vegetables. Regular canned tomato sauce and paste. Regular tomato and vegetable juices.  Fruits  Dried fruits. Canned fruit in light or heavy syrup. Fruit juice.  Meat and Other Protein   Products  Fatty cuts of meat. Ribs, chicken wings, bacon, sausage, bologna, salami, chitterlings, fatback, hot dogs, bratwurst, and packaged luncheon meats. Salted nuts and seeds. Canned beans with salt.  Dairy  Whole or 2% milk, cream, half-and-half, and cream cheese. Whole-fat or sweetened yogurt. Full-fat cheeses or blue cheese. Nondairy creamers and whipped toppings. Processed cheese, cheese spreads, or cheese  curds.  Condiments  Onion and garlic salt, seasoned salt, table salt, and sea salt. Canned and packaged gravies. Worcestershire sauce. Tartar sauce. Barbecue sauce. Teriyaki sauce. Soy sauce, including reduced sodium. Steak sauce. Fish sauce. Oyster sauce. Cocktail sauce. Horseradish. Ketchup and mustard. Meat flavorings and tenderizers. Bouillon cubes. Hot sauce. Tabasco sauce. Marinades. Taco seasonings. Relishes.  Fats and Oils  Butter, stick margarine, lard, shortening, ghee, and bacon fat. Coconut, palm kernel, or palm oils. Regular salad dressings.  Other  Pickles and olives. Salted popcorn and pretzels.  The items listed above may not be a complete list of foods and beverages to avoid. Contact your dietitian for more information.  WHERE CAN I FIND MORE INFORMATION?  National Heart, Lung, and Blood Institute: www.nhlbi.nih.gov/health/health-topics/topics/dash/     This information is not intended to replace advice given to you by your health care provider. Make sure you discuss any questions you have with your health care provider.     Document Released: 09/28/2011 Document Revised: 10/30/2014 Document Reviewed: 08/13/2013  Elsevier Interactive Patient Education ©2016 Elsevier Inc.

## 2016-05-11 NOTE — Progress Notes (Signed)
   Subjective:    Patient ID: Mason Andersen, male    DOB: [birth year], 55 y.o.   MRN: [id number]  Sinusitis This is a new problem. Episode onset: 3 days. Associated symptoms include congestion, coughing, ear pain, headaches and a sore throat. Treatments tried: mucinex. The treatment provided no relief.   Patient overall has had some head congestion drainage coughing not feeling good worse over the past several days started last weekend   Review of Systems  Constitutional: Negative for fever and activity change.  HENT: Positive for congestion, ear pain, rhinorrhea and sore throat.   Eyes: Negative for discharge.  Respiratory: Positive for cough. Negative for wheezing.   Cardiovascular: Negative for chest pain.  Neurological: Positive for headaches.       Objective:   Physical Exam  Constitutional: He appears well-developed.  HENT:  Head: Normocephalic.  Mouth/Throat: Oropharynx is clear and moist. No oropharyngeal exudate.  Neck: Normal range of motion.  Cardiovascular: Normal rate, regular rhythm and normal heart sounds.   No murmur heard. Pulmonary/Chest: Effort normal and breath sounds normal. He has no wheezes.  Lymphadenopathy:    He has no cervical adenopathy.  Neurological: He exhibits normal muscle tone.  Skin: Skin is warm and dry.  Nursing note and vitals reviewed.         Assessment & Plan:  Sinusitis Patient was seen today for upper respiratory illness. It is felt that the patient is dealing with sinusitis. Antibiotics were prescribed today. Importance of compliance with medication was discussed. Symptoms should gradually resolve over the course of the next several days. If high fevers, progressive illness, difficulty breathing, worsening condition or failure for symptoms to improve over the next several days then the patient is to follow-up. If any emergent conditions the patient is to follow-up in the emergency department otherwise to follow-up in the office. I  doubt an allergy component currently

## 2016-05-26 ENCOUNTER — Encounter: Payer: Self-pay | Admitting: Family Medicine

## 2016-05-26 ENCOUNTER — Ambulatory Visit (INDEPENDENT_AMBULATORY_CARE_PROVIDER_SITE_OTHER): Payer: 59 | Admitting: Family Medicine

## 2016-05-26 VITALS — BP 132/90 | Ht 71.0 in | Wt 214.0 lb

## 2016-05-26 DIAGNOSIS — M545 Low back pain, unspecified: Secondary | ICD-10-CM

## 2016-05-26 DIAGNOSIS — Z79899 Other long term (current) drug therapy: Secondary | ICD-10-CM | POA: Diagnosis not present

## 2016-05-26 DIAGNOSIS — E291 Testicular hypofunction: Secondary | ICD-10-CM | POA: Diagnosis not present

## 2016-05-26 DIAGNOSIS — E039 Hypothyroidism, unspecified: Secondary | ICD-10-CM

## 2016-05-26 DIAGNOSIS — R7989 Other specified abnormal findings of blood chemistry: Secondary | ICD-10-CM

## 2016-05-26 MED ORDER — TESTOSTERONE 20.25 MG/ACT (1.62%) TD GEL: TRANSDERMAL | 5 refills | Status: DC

## 2016-05-26 MED ORDER — LEVOTHYROXINE SODIUM 88 MCG PO TABS: 88.0000 ug | ORAL_TABLET | Freq: Every day | ORAL | 5 refills | Status: DC

## 2016-05-26 NOTE — Progress Notes (Signed)
   Subjective:    Patient ID: Mason Andersen, male    DOB: 03-01-1961, 55 y.o.   MRN: [id number]  HPIHypothyroidism. Takes levothyroxine . , takes faithfully. Prior blood work reviewed. No symptoms of high or low thyroidism. Claims compliance with medications.  Having low back pain for the past couple of days. Taking tylenol. Helped some. Pain is worse when bending over. Diffuse low lumbar pain , sig Worse with certain motions , stiff with motion , recals nmo injury wonders if related to his known osteoporosis.  Requesting refill on testosterone gel. Compliant with his testosterone supplement. Overall energy level good. Feels that things are at a reasonable level for him  Sinus infxn took the meds, overall symptoms are better  Now        Review of Systems No headache, no major weight loss or weight gain, no chest pain no back pain abdominal pain no change in bowel habits complete ROS otherwise negative     Objective:   Physical Exam  Alert vitals stable, NAD. Blood pressure good on repeat. HEENT normal. Lungs clear. Heart regular rate and rhythm. Positive low back diffuse tenderness to percussion negative straight leg raise      Assessment & Plan:  Impression 1 hypothyroidism prior blood work reviewed to continue same now appropriate blood work #2 low back pain likely lumbar strain and not #3 discussed rationale discussed #3 osteoporosis #4 hypo-testosterone is him on medication for this. Status uncertain plan refill all meds. Diet exercise discussed intervention for back discuss further recommendations based on blood work [employer]

## 2016-05-27 LAB — HEPATIC FUNCTION PANEL
ALBUMIN: 4.3 g/dL (ref 3.5–5.5)
ALT: 78 IU/L — ABNORMAL HIGH (ref 0–44)
AST: 26 IU/L (ref 0–40)
Alkaline Phosphatase: 88 IU/L (ref 39–117)
BILIRUBIN TOTAL: 0.6 mg/dL (ref 0.0–1.2)
BILIRUBIN, DIRECT: 0.13 mg/dL (ref 0.00–0.40)
TOTAL PROTEIN: 6.7 g/dL (ref 6.0–8.5)

## 2016-05-27 LAB — TESTOSTERONE: TESTOSTERONE: 272 ng/dL (ref 264–916)

## 2016-05-27 LAB — TSH: TSH: 2.72 u[IU]/mL (ref 0.450–4.500)

## 2016-06-04 ENCOUNTER — Encounter: Payer: Self-pay | Admitting: Family Medicine

## 2016-11-27 ENCOUNTER — Encounter: Payer: Self-pay | Admitting: Family Medicine

## 2016-11-27 ENCOUNTER — Ambulatory Visit (INDEPENDENT_AMBULATORY_CARE_PROVIDER_SITE_OTHER): Payer: BLUE CROSS/BLUE SHIELD | Admitting: Family Medicine

## 2016-11-27 VITALS — BP 124/82 | Ht 71.0 in | Wt 216.6 lb

## 2016-11-27 DIAGNOSIS — Z79899 Other long term (current) drug therapy: Secondary | ICD-10-CM | POA: Diagnosis not present

## 2016-11-27 DIAGNOSIS — Z13220 Encounter for screening for lipoid disorders: Secondary | ICD-10-CM

## 2016-11-27 DIAGNOSIS — R7989 Other specified abnormal findings of blood chemistry: Secondary | ICD-10-CM

## 2016-11-27 DIAGNOSIS — Z125 Encounter for screening for malignant neoplasm of prostate: Secondary | ICD-10-CM

## 2016-11-27 DIAGNOSIS — E349 Endocrine disorder, unspecified: Secondary | ICD-10-CM

## 2016-11-27 DIAGNOSIS — N529 Male erectile dysfunction, unspecified: Secondary | ICD-10-CM

## 2016-11-27 DIAGNOSIS — E039 Hypothyroidism, unspecified: Secondary | ICD-10-CM | POA: Diagnosis not present

## 2016-11-27 DIAGNOSIS — M810 Age-related osteoporosis without current pathological fracture: Secondary | ICD-10-CM

## 2016-11-27 MED ORDER — SILDENAFIL CITRATE 20 MG PO TABS: ORAL_TABLET | ORAL | 0 refills | Status: DC

## 2016-11-27 MED ORDER — LEVOTHYROXINE SODIUM 88 MCG PO TABS: 88.0000 ug | ORAL_TABLET | Freq: Every day | ORAL | 5 refills | Status: DC

## 2016-11-27 NOTE — Progress Notes (Signed)
   Subjective:    Patient ID: Mason Andersen, male    DOB: [birth year], 56 y.o.   MRN: [id number] Patient arrives office for follow-up of numerous concerns HPI Patient arrives for a follow up on hypothyroidism . Claims compliance with thyroid medicine. Does not miss a dose. No symptoms of high or low thyroid medicine.   and low testosterone. Patient states under his new insurance his current androgel will be to expensive and he would like to discuss alternatives. Originally low testosterone discovered via blood workup for osteoporosis.  Osteoporosis off meds now for over a yr, intact for 2 years. Patient was on Fosamax. Has not had a bone density test recently. Needs to have one    Review of Systems No headache, no major weight loss or weight gain, no chest pain no back pain abdominal pain no change in bowel habits complete ROS otherwise negative     Objective:   Physical Exam  Alert vitals stable, NAD. Blood pressure good on repeat. HEENT normal. Lungs clear. Heart regular rate and rhythm.       Assessment & Plan:  Impression 1 hypothyroidism prior blood work reviewed currently stable status uncertain him her 2 osteoporosis status uncertain time for bone density test #3 hypo-testosterone is him patient's insurance shooting through the roof. We'll need to do blood work after one month off to see just how severe it is #4 erectile dysfunction discuss patient like to try medication. This been coming on over the recent years prescriptions given appropriate blood work further recommendations based on results.

## 2016-12-01 ENCOUNTER — Ambulatory Visit (HOSPITAL_COMMUNITY)
Admission: RE | Admit: 2016-12-01 | Discharge: 2016-12-01 | Disposition: A | Discharge status: Home / Self Care | Payer: BLUE CROSS/BLUE SHIELD | Source: Ambulatory Visit | Attending: Family Medicine | Admitting: Family Medicine

## 2016-12-01 DIAGNOSIS — M8588 Other specified disorders of bone density and structure, other site: Secondary | ICD-10-CM | POA: Diagnosis not present

## 2016-12-01 DIAGNOSIS — M85852 Other specified disorders of bone density and structure, left thigh: Secondary | ICD-10-CM | POA: Diagnosis not present

## 2016-12-01 DIAGNOSIS — M810 Age-related osteoporosis without current pathological fracture: Secondary | ICD-10-CM | POA: Diagnosis present

## 2016-12-27 ENCOUNTER — Encounter: Payer: Self-pay | Admitting: Family Medicine

## 2016-12-27 LAB — TSH: TSH: 2.94 u[IU]/mL (ref 0.450–4.500)

## 2016-12-27 LAB — BASIC METABOLIC PANEL
BUN / CREAT RATIO: 13 (ref 9–20)
BUN: 13 mg/dL (ref 6–24)
CALCIUM: 9.1 mg/dL (ref 8.7–10.2)
CHLORIDE: 102 mmol/L (ref 96–106)
CO2: 24 mmol/L (ref 18–29)
CREATININE: 1.04 mg/dL (ref 0.76–1.27)
GFR calc Af Amer: 94 mL/min/{1.73_m2} (ref >59)
GFR calc non Af Amer: 82 mL/min/{1.73_m2} (ref >59)
GLUCOSE: 104 mg/dL — AB (ref 65–99)
Potassium: 4.6 mmol/L (ref 3.5–5.2)
Sodium: 141 mmol/L (ref 134–144)

## 2016-12-27 LAB — HEPATIC FUNCTION PANEL
ALBUMIN: 4.3 g/dL (ref 3.5–5.5)
ALK PHOS: 81 IU/L (ref 39–117)
ALT: 32 IU/L (ref 0–44)
AST: 23 IU/L (ref 0–40)
Bilirubin Total: 0.5 mg/dL (ref 0.0–1.2)
Bilirubin, Direct: 0.11 mg/dL (ref 0.00–0.40)
Total Protein: 6.9 g/dL (ref 6.0–8.5)

## 2016-12-27 LAB — LIPID PANEL
CHOLESTEROL TOTAL: 217 mg/dL — AB (ref 100–199)
Chol/HDL Ratio: 4.2 ratio units (ref 0.0–5.0)
HDL: 52 mg/dL (ref >39)
LDL CALC: 137 mg/dL — AB (ref 0–99)
TRIGLYCERIDES: 142 mg/dL (ref 0–149)
VLDL CHOLESTEROL CAL: 28 mg/dL (ref 5–40)

## 2016-12-27 LAB — PSA: Prostate Specific Ag, Serum: 1 ng/mL (ref 0.0–4.0)

## 2016-12-27 LAB — TESTOSTERONE: TESTOSTERONE: 368 ng/dL (ref 264–916)

## 2017-05-07 ENCOUNTER — Telehealth: Payer: Self-pay | Admitting: Family Medicine

## 2017-05-07 DIAGNOSIS — Z1211 Encounter for screening for malignant neoplasm of colon: Secondary | ICD-10-CM

## 2017-05-07 NOTE — Telephone Encounter (Signed)
Let's do 

## 2017-05-07 NOTE — Telephone Encounter (Signed)
Pt called [facility] for [facility]  Please initiate in system so that I may process & notify pt

## 2017-05-07 NOTE — Telephone Encounter (Signed)
Order placed, and pt is aware to expect a call from either our office or [facility].

## 2017-05-08 ENCOUNTER — Other Ambulatory Visit: Payer: Self-pay | Admitting: Family Medicine

## 2017-05-08 NOTE — Telephone Encounter (Signed)
Last seen 11/27/16

## 2017-05-15 ENCOUNTER — Encounter: Payer: Self-pay | Admitting: Family Medicine

## 2017-05-16 ENCOUNTER — Telehealth: Payer: Self-pay

## 2017-05-16 NOTE — Telephone Encounter (Signed)
[phone] patient called to schedule his tcs

## 2017-05-17 NOTE — Telephone Encounter (Signed)
Ok to schedule with 12.5 mg preprocedure Phenergan 

## 2017-05-17 NOTE — Telephone Encounter (Signed)
Gastroenterology Pre-Procedure Review  Request Date: Requesting Physician: DR.LUKING  LAST TCS WAS 10 YEARS  PATIENT REVIEW QUESTIONS: The patient responded to the following health history questions as indicated:    1. Diabetes Melitis: NO 2. Joint replacements in the past 12 months: NO 3. Major health problems in the past 3 months: NO 4. Has an artificial valve or MVP: NO 5. Has a defibrillator: NO 6. Has been advised in past to take antibiotics in advance of a procedure like teeth cleaning: NO 7. Family history of colon cancer: NO 8. Alcohol Use: YES ABOUT 9 GLASS IN A WEEK 9. History of sleep apnea: NO 10. History of coronary artery or other vascular stents placed within the last 12 months: NO 11. History of any prior anesthesia complications: NO    MEDICATIONS & ALLERGIES:    Patient reports the following regarding taking any blood thinners:   Plavix? NO Aspirin? NO Coumadin? NO Brilinta? NO Xarelto? NO Eliquis? NO Pradaxa? NO Savaysa? NO Effient? NO  Patient confirms/reports the following medications:  Current Outpatient Prescriptions  Medication Sig Dispense Refill  . Calcium Carbonate (CALCIUM 600 PO) Take by mouth 2 (two) times daily.    Marland Kitchen. levothyroxine (SYNTHROID, LEVOTHROID) 88 MCG tablet Take 1 tablet (88 mcg total) by mouth daily before breakfast. 30 tablet 5  . Omega-3 Fatty Acids (FISH OIL) 1000 MG CAPS Take by mouth 2 (two) times daily.    . vitamin B-12 (CYANOCOBALAMIN) 100 MCG tablet Take 100 mcg by mouth daily.    . sildenafil (REVATIO) 20 MG tablet TAKE 2 TABLETS BY MOUTH ONCE DAILY 2 HOURS BEFORE SEX (Patient not taking: Reported on 05/17/2017) 30 tablet 0  . Testosterone 20.25 MG/ACT (1.62%) GEL Four pumps daily (Patient not taking: Reported on 05/17/2017) 75 g 5   No current facility-administered medications for this visit.     Patient confirms/reports the following allergies:  No Known Allergies  No orders of the defined types were placed in this  encounter.   AUTHORIZATION INFORMATION [place]  [place]: [id number],  Group #: [id number] Pre-Cert / Berkley HarveyAuth required:  Pre-Cert / Auth #:    SCHEDULE INFORMATION: Procedure has been scheduled as follows:  Date: , Time:   Location:   This Gastroenterology Pre-Precedure Review Form is being routed to the following provider(s):

## 2017-05-18 ENCOUNTER — Other Ambulatory Visit: Payer: Self-pay

## 2017-05-18 DIAGNOSIS — Z1211 Encounter for screening for malignant neoplasm of colon: Secondary | ICD-10-CM

## 2017-05-18 MED ORDER — PEG 3350-KCL-NA BICARB-NACL 420 G PO SOLR: 4000.0000 mL | ORAL | 0 refills | Status: DC

## 2017-05-18 NOTE — Telephone Encounter (Addendum)
Pt is set up for TCS on 06/18/17 @ 12:45 pm with [facility]. He is aware of date and time.

## 2017-05-28 ENCOUNTER — Ambulatory Visit (INDEPENDENT_AMBULATORY_CARE_PROVIDER_SITE_OTHER): Payer: BLUE CROSS/BLUE SHIELD | Admitting: Family Medicine

## 2017-05-28 ENCOUNTER — Encounter: Payer: Self-pay | Admitting: Family Medicine

## 2017-05-28 VITALS — BP 132/76 | Ht 71.0 in | Wt 218.1 lb

## 2017-05-28 DIAGNOSIS — E039 Hypothyroidism, unspecified: Secondary | ICD-10-CM | POA: Diagnosis not present

## 2017-05-28 NOTE — Progress Notes (Signed)
   Subjective:    Patient ID: Mason Andersen, male    DOB: Oct 29, 1960, 56 y.o.   MRN: [id number]  HPI Patient is here today to follow up on Hypothyroidism. States he is compliant with medications.States he does not diet or exercise enough.  Results for orders placed or performed in visit on 11/27/16  TSH  Result Value Ref Range   TSH 2.940 0.450 - 4.500 uIU/mL  PSA  Result Value Ref Range   Prostate Specific Ag, Serum 1.0 0.0 - 4.0 ng/mL  Testosterone  Result Value Ref Range   Testosterone 368 264 - 916 ng/dL  Basic metabolic panel  Result Value Ref Range   Glucose 104 (H) 65 - 99 mg/dL   BUN 13 6 - 24 mg/dL   Creatinine, Ser 4.091.04 0.76 - 1.27 mg/dL   GFR calc non Af Amer 82 >59 mL/min/1.73   GFR calc Af Amer 94 >59 mL/min/1.73   BUN/Creatinine Ratio 13 9 - 20   Sodium 141 134 - 144 mmol/L   Potassium 4.6 3.5 - 5.2 mmol/L   Chloride 102 96 - 106 mmol/L   CO2 24 18 - 29 mmol/L   Calcium 9.1 8.7 - 10.2 mg/dL  Lipid panel  Result Value Ref Range   Cholesterol, Total 217 (H) 100 - 199 mg/dL   Triglycerides 811142 0 - 149 mg/dL   HDL 52 >91>39 mg/dL   VLDL Cholesterol Cal 28 5 - 40 mg/dL   LDL Calculated 478137 (H) 0 - 99 mg/dL   Chol/HDL Ratio 4.2 0.0 - 5.0 ratio units  Hepatic function panel  Result Value Ref Range   Total Protein 6.9 6.0 - 8.5 g/dL   Albumin 4.3 3.5 - 5.5 g/dL   Bilirubin Total 0.5 0.0 - 1.2 mg/dL   Bilirubin, Direct 2.950.11 0.00 - 0.40 mg/dL   Alkaline Phosphatase 81 39 - 117 IU/L   AST 23 0 - 40 IU/L   ALT 32 0 - 44 IU/L   exercsing reg,  Decent appetite, not eating the best  Doing colonoscopy on the 27th  Taking b12 , not sure if helping much ,Patient started this on his own. Next  Claims complete compliance with thyroid medication. Notes slight tiredness. States does not want to do additional blood work to assess at this time  Review of Systems No headache, no major weight loss or weight gain, no chest pain no back pain abdominal pain no change in bowel  habits complete ROS otherwise negative     Objective:   Physical Exam Alert vitals stable, NAD. Blood pressure good on repeat. HEENT normal. Lungs clear. Heart regular rate and rhythm. Thyroid not palpable blood pressure on repeat good level       Assessment & Plan:  Impression 1 hypothyroidism. TSH excellent earlier this year. Patient does not want to get blood work again at this time which I think is reasonable. Compliance discussed. Diet exercise discussed. Follow-up in 6 months for wellness plus chronic visit.

## 2017-05-28 NOTE — Patient Instructions (Signed)

## 2017-06-08 DIAGNOSIS — H6062 Unspecified chronic otitis externa, left ear: Secondary | ICD-10-CM | POA: Diagnosis not present

## 2017-06-08 DIAGNOSIS — H6123 Impacted cerumen, bilateral: Secondary | ICD-10-CM | POA: Diagnosis not present

## 2017-06-11 DIAGNOSIS — H6122 Impacted cerumen, left ear: Secondary | ICD-10-CM | POA: Diagnosis not present

## 2017-06-11 DIAGNOSIS — H6062 Unspecified chronic otitis externa, left ear: Secondary | ICD-10-CM | POA: Diagnosis not present

## 2017-06-18 ENCOUNTER — Encounter (HOSPITAL_COMMUNITY)
Admission: RE | Disposition: A | Discharge status: Home / Self Care | Payer: Self-pay | Source: Ambulatory Visit | Attending: Gastroenterology

## 2017-06-18 ENCOUNTER — Ambulatory Visit (HOSPITAL_COMMUNITY)
Admission: RE | Admit: 2017-06-18 | Discharge: 2017-06-18 | Disposition: A | Discharge status: Home / Self Care | Payer: BLUE CROSS/BLUE SHIELD | Source: Ambulatory Visit | Attending: Gastroenterology | Admitting: Gastroenterology

## 2017-06-18 ENCOUNTER — Encounter (HOSPITAL_COMMUNITY): Payer: Self-pay | Admitting: *Deleted

## 2017-06-18 DIAGNOSIS — E785 Hyperlipidemia, unspecified: Secondary | ICD-10-CM | POA: Insufficient documentation

## 2017-06-18 DIAGNOSIS — Z8052 Family history of malignant neoplasm of bladder: Secondary | ICD-10-CM | POA: Diagnosis not present

## 2017-06-18 DIAGNOSIS — E039 Hypothyroidism, unspecified: Secondary | ICD-10-CM | POA: Diagnosis not present

## 2017-06-18 DIAGNOSIS — Z1212 Encounter for screening for malignant neoplasm of rectum: Secondary | ICD-10-CM | POA: Diagnosis not present

## 2017-06-18 DIAGNOSIS — Z1211 Encounter for screening for malignant neoplasm of colon: Secondary | ICD-10-CM

## 2017-06-18 DIAGNOSIS — Z79899 Other long term (current) drug therapy: Secondary | ICD-10-CM | POA: Diagnosis not present

## 2017-06-18 DIAGNOSIS — Q438 Other specified congenital malformations of intestine: Secondary | ICD-10-CM | POA: Insufficient documentation

## 2017-06-18 DIAGNOSIS — E559 Vitamin D deficiency, unspecified: Secondary | ICD-10-CM | POA: Diagnosis not present

## 2017-06-18 DIAGNOSIS — M810 Age-related osteoporosis without current pathological fracture: Secondary | ICD-10-CM | POA: Diagnosis not present

## 2017-06-18 DIAGNOSIS — K648 Other hemorrhoids: Secondary | ICD-10-CM | POA: Insufficient documentation

## 2017-06-18 HISTORY — PX: COLONOSCOPY: SHX5424

## 2017-06-18 HISTORY — DX: Hypothyroidism, unspecified: E03.9

## 2017-06-18 SURGERY — COLONOSCOPY
Anesthesia: Moderate Sedation

## 2017-06-18 MED ORDER — MEPERIDINE HCL 100 MG/ML IJ SOLN
INTRAMUSCULAR | Status: AC
  Filled 2017-06-18: qty 2

## 2017-06-18 MED ORDER — SODIUM CHLORIDE 0.9% FLUSH
INTRAVENOUS | Status: AC
  Filled 2017-06-18: qty 10

## 2017-06-18 MED ORDER — SODIUM CHLORIDE 0.9 % IV SOLN
INTRAVENOUS | Status: DC
  Administered 2017-06-18: 12:00:00 via INTRAVENOUS

## 2017-06-18 MED ORDER — MIDAZOLAM HCL 5 MG/5ML IJ SOLN
INTRAMUSCULAR | Status: AC
  Filled 2017-06-18: qty 10

## 2017-06-18 MED ORDER — PROMETHAZINE HCL 25 MG/ML IJ SOLN
INTRAMUSCULAR | Status: AC
  Filled 2017-06-18: qty 1

## 2017-06-18 MED ORDER — PROMETHAZINE HCL 25 MG/ML IJ SOLN
12.5000 mg | Freq: Once | INTRAMUSCULAR | Status: AC
  Administered 2017-06-18: 12.5 mg via INTRAVENOUS

## 2017-06-18 MED ORDER — STERILE WATER FOR IRRIGATION IR SOLN
Status: DC | PRN
  Administered 2017-06-18: 100 mL

## 2017-06-18 MED ORDER — MIDAZOLAM HCL 5 MG/5ML IJ SOLN
INTRAMUSCULAR | Status: DC | PRN
  Administered 2017-06-18 (×2): 1 mg via INTRAVENOUS
  Administered 2017-06-18: 2 mg via INTRAVENOUS

## 2017-06-18 MED ORDER — MEPERIDINE HCL 100 MG/ML IJ SOLN
INTRAMUSCULAR | Status: DC | PRN
  Administered 2017-06-18: 50 mg via INTRAVENOUS
  Administered 2017-06-18: 25 mg via INTRAVENOUS

## 2017-06-18 NOTE — H&P (Signed)
  Primary Care Physician:  Merlyn Albert, MD Primary Gastroenterologist:  Dr. Darrick Penna  Pre-Procedure History & Physical: HPI:  Mason Andersen is a 56 y.o. male here for COLON CANCER SCREENING.  Past Medical History:  Diagnosis Date  . H/O vitamin D deficiency   . Hyperlipidemia   . Hypotestosteronemia   . Hypothyroidism   . Osteoporosis   . Thyroid disease     Past Surgical History:  Procedure Laterality Date  . TONSILLECTOMY      Prior to Admission medications   Medication Sig Start Date End Date Taking? Authorizing Provider  Calcium Carb-Cholecalciferol (CALCIUM 600-D PO) Take 1 tablet by mouth daily.   Yes [provider]  levothyroxine (SYNTHROID, LEVOTHROID) 88 MCG tablet Take 1 tablet (88 mcg total) by mouth daily before breakfast. 11/27/16  Yes Merlyn Albert, MD  Omega-3 Fatty Acids (FISH OIL) 1000 MG CAPS Take 1,000 mg by mouth 2 (two) times daily.    Yes [provider]  sildenafil (REVATIO) 20 MG tablet TAKE 2 TABLETS BY MOUTH ONCE DAILY 2 HOURS BEFORE SEX 05/08/17  Yes Merlyn Albert, MD  vitamin B-12 (CYANOCOBALAMIN) 100 MCG tablet Take 100 mcg by mouth daily.   Yes [provider]    Allergies as of 05/18/2017  . (No Known Allergies)    Family History  Problem Relation Age of Onset  . Cancer Father        bladder  . Cancer Brother        bladder  . Colon cancer Neg Hx     Social History   Social History  . Marital status: Divorced    Spouse name: N/A  . Number of children: N/A  . Years of education: N/A   Occupational History  . Not on file.   Social History Main Topics  . Smoking status: Never Smoker  . Smokeless tobacco: Never Used  . Alcohol use 1.8 oz/week    3 Shots of liquor per week  . Drug use: No  . Sexual activity: Not on file   Other Topics Concern  . Not on file   Social History Narrative  . No narrative on file    Review of Systems: See HPI, otherwise negative ROS   Physical Exam: BP  136/86   Pulse 76   Temp 98.4 F (36.9 C) (Oral)   Ht 5\' 11"  (1.803 m)   Wt 218 lb (98.9 kg)   SpO2 98%   BMI 30.40 kg/m  General:   Alert,  pleasant and cooperative in NAD Head:  Normocephalic and atraumatic. Neck:  Supple; Lungs:  Clear throughout to auscultation.    Heart:  Regular rate and rhythm. Abdomen:  Soft, nontender and nondistended. Normal bowel sounds, without guarding, and without rebound.   Neurologic:  Alert and  oriented x4;  grossly normal neurologically.  Impression/Plan:     SCREENING  Plan:  1. TCS TODAY. DISCUSSED PROCEDURE, BENEFITS, & RISKS: < 1% chance of medication reaction, bleeding, perforation, or rupture of spleen/liver.

## 2017-06-18 NOTE — Op Note (Signed)
[facility] Patient Name: Mason Andersen Procedure Date: 06/18/2017 12:19 PM MRN: [id number] Date of Birth: 09-Dec-1960 Attending MD: Jonette Eva , MD CSN: [id number] Age: 56 Admit Type: Outpatient Procedure:                Colonoscopy, SCREENING Indications:              Screening for colorectal malignant neoplasm Providers:                Jonette Eva, MD, Jannett Celestine, RN, Edrick Kins, RN Referring MD:             Vilinda Blanks. Luking Medicines:                Meperidine 75 mg IV, Midazolam 4 mg IV Complications:            No immediate complications. Estimated Blood Loss:     Estimated blood loss: none. Procedure:                Pre-Anesthesia Assessment:                           - Prior to the procedure, a History and Physical                            was performed, and patient medications and                            allergies were reviewed. The patient's tolerance of                            previous anesthesia was also reviewed. The risks                            and benefits of the procedure and the sedation                            options and risks were discussed with the patient.                            All questions were answered, and informed consent                            was obtained. Prior Anticoagulants: The patient has                            taken no previous anticoagulant or antiplatelet                            agents. ASA Grade Assessment: II - A patient with                            mild systemic disease. After reviewing the risks                            and benefits, the patient was deemed in  satisfactory condition to undergo the procedure.                            After obtaining informed consent, the colonoscope                            was passed under direct vision. Throughout the                            procedure, the patient's blood pressure, pulse, and                            oxygen  saturations were monitored continuously. The                            [id number]) scope was introduced through the                            anus and advanced to the the cecum, identified by                            appendiceal orifice and ileocecal valve. The                            colonoscopy was somewhat difficult due to a                            tortuous colon. Successful completion of the                            procedure was aided by COLOWRAP. The patient                            tolerated the procedure well. The quality of the                            bowel preparation was excellent. The ileocecal                            valve, appendiceal orifice, and rectum were                            photographed. Scope In: 12:45:20 PM Scope Out: 1:00:18 PM Scope Withdrawal Time: 0 hours 12 minutes 35 seconds  Total Procedure Duration: 0 hours 14 minutes 58 seconds  Findings:      The recto-sigmoid colon and sigmoid colon were mildly redundant.      The exam was otherwise without abnormality.      Internal hemorrhoids were found during retroflexion. The hemorrhoids       were small. Impression:               - SLIGHTLY Redundant LEFT colon.                           - The examination was otherwise normal.                           -  Internal hemorrhoids. Moderate Sedation:      Moderate (conscious) sedation was administered by the endoscopy nurse       and supervised by the endoscopist. The following parameters were       monitored: oxygen saturation, heart rate, blood pressure, and response       to care. Total physician intraservice time was 26 minutes. Recommendation:           - Repeat colonoscopy in 10 years for surveillance.                           - High fiber diet.                           - Continue present medications.                           - Patient has a contact number available for                            emergencies. The signs and  symptoms of potential                            delayed complications were discussed with the                            patient. Return to normal activities tomorrow.                            Written discharge instructions were provided to the                            patient. Procedure Code(s):        --- Professional ---                           [phone], Colonoscopy, flexible; diagnostic, including                            collection of specimen(s) by brushing or washing,                            when performed (separate procedure)                           99152, Moderate sedation services provided by the                            same physician or other qualified health care                            professional performing the diagnostic or                            therapeutic service that the sedation supports,  requiring the presence of an independent trained                            observer to assist in the monitoring of the                            patient's level of consciousness and physiological                            status; initial 15 minutes of intraservice time,                            patient age 43 years or older                           [phone], Moderate sedation services; each additional                            15 minutes intraservice time Diagnosis Code(s):        --- Professional ---                           Z12.11, Encounter for screening for malignant                            neoplasm of colon                           K64.8, Other hemorrhoids                           Q43.8, Other specified congenital malformations of                            intestine CPT copyright 2016 [employer]. All rights reserved. The codes documented in this report are preliminary and upon coder review may  be revised to meet current compliance requirements. Jonette Eva, MD Jonette Eva, MD 06/18/2017 1:09:33  PM This report has been signed electronically. Number of Addenda: 0

## 2017-06-18 NOTE — Discharge Instructions (Signed)
You have small internal hemorrhoids. YOU DID NOT HAVE ANY POLYPS. ° ° °DRINK WATER TO KEEP YOUR URINE LIGHT YELLOW. ° °FOLLOW A HIGH FIBER DIET. AVOID ITEMS THAT CAUSE BLOATING. SEE INFO BELOW. ° °USE PREPARATION H FOUR TIMES  A DAY IF NEEDED TO RELIEVE RECTAL PAIN/PRESSURE/BLEEDING. ° °Next colonoscopy in 10 years. ° °Colonoscopy °Care After °Read the instructions outlined below and refer to this sheet in the next week. These discharge instructions provide you with general information on caring for yourself after you leave the hospital. While your treatment has been planned according to the most current medical practices available, unavoidable complications occasionally occur. If you have any problems or questions after discharge, call DR. Yaminah Andersen, [phone]. ° °ACTIVITY °· You may resume your regular activity, but move at a slower pace for the next 24 hours.  °· Take frequent rest periods for the next 24 hours.  °· Walking will help get rid of the air and reduce the bloated feeling in your belly (abdomen).  °· No driving for 24 hours (because of the medicine (anesthesia) used during the test).  °· You may shower.  °· Do not sign any important legal documents or operate any machinery for 24 hours (because of the anesthesia used during the test).  °·  °NUTRITION °· Drink plenty of fluids.  °· You may resume your normal diet as instructed by your doctor.  °· Begin with a light meal and progress to your normal diet. Heavy or fried foods are harder to digest and may make you feel sick to your stomach (nauseated).  °· Avoid alcoholic beverages for 24 hours or as instructed.  °·  °MEDICATIONS °· You may resume your normal medications. °·  °WHAT YOU CAN EXPECT TODAY °· Some feelings of bloating in the abdomen.  °· Passage of more gas than usual.  °· Spotting of blood in your stool or on the toilet paper °· .  °IF YOU HAD POLYPS REMOVED DURING THE COLONOSCOPY: °· Eat a soft diet IF YOU HAVE NAUSEA, BLOATING, ABDOMINAL  PAIN, OR VOMITING. °·   °FINDING OUT THE RESULTS OF YOUR TEST °Not all test results are available during your visit. DR. Eudell Mcphee WILL CALL YOU WITHIN 14 DAYS OF YOUR PROCEDUE WITH YOUR RESULTS. Do not assume everything is normal if you have not heard from DR. Ariann Khaimov, CALL HER OFFICE AT [phone]. ° °SEEK IMMEDIATE MEDICAL ATTENTION AND CALL THE OFFICE: [phone] IF: °· You have more than a spotting of blood in your stool.  °· Your belly is swollen (abdominal distention).  °· You are nauseated or vomiting.  °· You have a temperature over 101F.  °· You have abdominal pain or discomfort that is severe or gets worse throughout the day. ° °High-Fiber Diet °A high-fiber diet changes your normal diet to include more whole grains, legumes, fruits, and vegetables. Changes in the diet involve replacing refined carbohydrates with unrefined foods. The calorie level of the diet is essentially unchanged. The Dietary Reference Intake (recommended amount) for adult males is 38 grams per day. For adult females, it is 25 grams per day. Pregnant and lactating women should consume 28 grams of fiber per day. °Fiber is the intact part of a plant that is not broken down during digestion. Functional fiber is fiber that has been isolated from the plant to provide a beneficial effect in the body. °PURPOSE °· Increase stool bulk.  °· Ease and regulate bowel movements.  °· Lower cholesterol.  °· REDUCE RISK OF COLON CANCER ° °  INDICATIONS THAT YOU NEED MORE FIBER °· Constipation and hemorrhoids.  °· Uncomplicated diverticulosis (intestine condition) and irritable bowel syndrome.  °· Weight management.  °· As a protective measure against hardening of the arteries (atherosclerosis), diabetes, and cancer.  ° °GUIDELINES FOR INCREASING FIBER IN THE DIET °· Start adding fiber to the diet slowly. A gradual increase of about 5 more grams (2 slices of whole-wheat bread, 2 servings of most fruits or vegetables, or 1 bowl of high-fiber cereal) per  day is best. Too rapid an increase in fiber may result in constipation, flatulence, and bloating.  °· Drink enough water and fluids to keep your urine clear or pale yellow. Water, juice, or caffeine-free drinks are recommended. Not drinking enough fluid may cause constipation.  °· Eat a variety of high-fiber foods rather than one type of fiber.  °· Try to increase your intake of fiber through using high-fiber foods rather than fiber pills or supplements that contain small amounts of fiber.  °· The goal is to change the types of food eaten. Do not supplement your present diet with high-fiber foods, but replace foods in your present diet.   ° °INCLUDE A VARIETY OF FIBER SOURCES °· Replace refined and processed grains with whole grains, canned fruits with fresh fruits, and incorporate other fiber sources. White rice, white breads, and most bakery goods contain little or no fiber.  °· Brown whole-grain rice, buckwheat oats, and many fruits and vegetables are all good sources of fiber. These include: broccoli, Brussels sprouts, cabbage, cauliflower, beets, sweet potatoes, white potatoes (skin on), carrots, tomatoes, eggplant, squash, berries, fresh fruits, and dried fruits.  °· Cereals appear to be the richest source of fiber. Cereal fiber is found in whole grains and bran. Bran is the fiber-rich outer coat of cereal grain, which is largely removed in refining. In whole-grain cereals, the bran remains. In breakfast cereals, the largest amount of fiber is found in those with "bran" in their names. The fiber content is sometimes indicated on the label.  °· You may need to include additional fruits and vegetables each day.  °· In baking, for 1 cup white flour, you may use the following substitutions:  °· 1 cup whole-wheat flour minus 2 tablespoons.  °· 1/2 cup white flour plus 1/2 cup whole-wheat flour.  ° °Hemorrhoids °Hemorrhoids are dilated (enlarged) veins around the rectum. Sometimes clots will form in the veins. This  makes them swollen and painful. These are called thrombosed hemorrhoids. °Causes of hemorrhoids include: °· Constipation.  °· Straining to have a bowel movement. °·  HEAVY LIFTING ° °HOME CARE INSTRUCTIONS °· Eat a well balanced diet and drink 6 to 8 glasses of water every day to avoid constipation. You may also use a bulk laxative.  °· Avoid straining to have bowel movements.  °· Keep anal area dry and clean.  °· Do not use a donut shaped pillow or sit on the toilet for long periods. This increases blood pooling and pain.  °· Move your bowels when your body has the urge; this will require less straining and will decrease pain and pressure.  ° °

## 2017-06-21 ENCOUNTER — Encounter (HOSPITAL_COMMUNITY): Payer: Self-pay | Admitting: Gastroenterology

## 2017-06-25 ENCOUNTER — Other Ambulatory Visit: Payer: Self-pay | Admitting: Family Medicine

## 2017-08-16 ENCOUNTER — Ambulatory Visit (INDEPENDENT_AMBULATORY_CARE_PROVIDER_SITE_OTHER): Payer: BLUE CROSS/BLUE SHIELD | Admitting: Family Medicine

## 2017-08-16 ENCOUNTER — Encounter: Payer: Self-pay | Admitting: Family Medicine

## 2017-08-16 VITALS — BP 128/88 | Temp 99.3°F | Ht 71.0 in | Wt 215.0 lb

## 2017-08-16 DIAGNOSIS — J329 Chronic sinusitis, unspecified: Secondary | ICD-10-CM

## 2017-08-16 MED ORDER — AMOXICILLIN-POT CLAVULANATE 875-125 MG PO TABS: 1.0000 | ORAL_TABLET | Freq: Two times a day (BID) | ORAL | 0 refills | Status: DC

## 2017-08-16 NOTE — Progress Notes (Signed)
   Subjective:    Patient ID: Mason Andersen, male    DOB: 09-Apr-1961, 56 y.o.   MRN: [id number]  Sinusitis  This is a new problem. Associated symptoms include coughing, ear pain, headaches and a sore throat. (Fever, wheezing) Treatments tried: alka seltzer, mucinex.    Felt sick sun night , felt bad  achey dim energy  Ran fever  Last two days now headache, frontal in nature, worse with position  Dim appetite   Has also moved into the chest  Ear pain and head apin    Pos fever and chills dim appetite      Review of Systems  HENT: Positive for ear pain and sore throat.   Respiratory: Positive for cough.   Neurological: Positive for headaches.       Objective:   Physical Exam   Alert, mild malaise. Hydration good Vitals stable. frontal/ maxillary tenderness evident positive nasal congestion. pharynx normal neck supple  lungs clear/no crackles or wheezes. heart regular in rhythm      Assessment & Plan:  Impression rhinosinusitis likely post viral, discussed with patient. plan antibiotics prescribed. Questions answered. Symptomatic care discussed. warning signs discussed. WSL

## 2017-08-20 ENCOUNTER — Other Ambulatory Visit: Payer: Self-pay | Admitting: *Deleted

## 2017-08-20 ENCOUNTER — Telehealth: Payer: Self-pay | Admitting: Family Medicine

## 2017-08-20 MED ORDER — CEFPROZIL 500 MG PO TABS
500.0000 mg | ORAL_TABLET | Freq: Two times a day (BID) | ORAL | 0 refills | Status: DC
Start: 2017-08-20 — End: 2017-09-03

## 2017-08-20 NOTE — Telephone Encounter (Signed)
cefzil 500 bid ten d dicument sensitivity

## 2017-08-20 NOTE — Telephone Encounter (Signed)
Discussed with pt. New med sent to pharm. Stop augmentin. Added augmentin to allergy list. Pt verbalized understanding.

## 2017-08-20 NOTE — Telephone Encounter (Signed)
Patient called to check on this

## 2017-08-20 NOTE — Telephone Encounter (Signed)
Prescribed augmentin.

## 2017-08-20 NOTE — Telephone Encounter (Signed)
Patient seen Dr. Brett CanalesSteve on 08/16/17 for rhinosinusitis.  He said the antibiotic he was prescribed made his mouth a little sore, with white on his tongue.  He stopped taking the antibiotic and wants to know if Dr. Brett CanalesSteve can change it and call something else in?  [facility]

## 2017-08-30 ENCOUNTER — Telehealth: Payer: Self-pay | Admitting: Family Medicine

## 2017-08-30 NOTE — Telephone Encounter (Signed)
Patient saw Dr. Brett CanalesSteve a couple weeks ago and was put on augmentin. Patient had problems with taking that and it was changed to cefzil. Has finished antibiotic and it was helping but still has some symptoms with cough. Thinks he needs to take the antibiotic longer and maybe some cough meds for night time use.  [place] [facility] [place].

## 2017-08-30 NOTE — Telephone Encounter (Signed)
Ref same ten d plus hycodan 3 oz one tspn qhs

## 2017-08-30 NOTE — Telephone Encounter (Signed)
Patient seen 08/16/17

## 2017-09-03 MED ORDER — HYDROCODONE-HOMATROPINE 5-1.5 MG/5ML PO SYRP: ORAL_SOLUTION | ORAL | 0 refills | Status: DC

## 2017-09-03 MED ORDER — CEFPROZIL 500 MG PO TABS: 500.0000 mg | ORAL_TABLET | Freq: Two times a day (BID) | ORAL | 0 refills | Status: DC

## 2017-09-03 NOTE — Telephone Encounter (Signed)
Spoke with patient and informed him per Dr.Steve Luking- We are sending in another round of antibiotics and we are also have a prescription for you to pick up for Hycodan. Patient verbalized understanding

## 2017-09-03 NOTE — Telephone Encounter (Signed)
Patient called to check on this message.  He said he is still having a cough and never heard anything back from our office last week.

## 2017-11-26 ENCOUNTER — Ambulatory Visit (INDEPENDENT_AMBULATORY_CARE_PROVIDER_SITE_OTHER): Payer: BLUE CROSS/BLUE SHIELD | Admitting: Family Medicine

## 2017-11-26 ENCOUNTER — Encounter: Payer: Self-pay | Admitting: Family Medicine

## 2017-11-26 VITALS — BP 128/82 | Ht 71.0 in | Wt 221.0 lb

## 2017-11-26 DIAGNOSIS — N529 Male erectile dysfunction, unspecified: Secondary | ICD-10-CM

## 2017-11-26 DIAGNOSIS — Z0001 Encounter for general adult medical examination with abnormal findings: Secondary | ICD-10-CM

## 2017-11-26 DIAGNOSIS — E039 Hypothyroidism, unspecified: Secondary | ICD-10-CM

## 2017-11-26 DIAGNOSIS — Z13220 Encounter for screening for lipoid disorders: Secondary | ICD-10-CM | POA: Diagnosis not present

## 2017-11-26 DIAGNOSIS — Z0000 Encounter for general adult medical examination without abnormal findings: Secondary | ICD-10-CM

## 2017-11-26 DIAGNOSIS — Z79899 Other long term (current) drug therapy: Secondary | ICD-10-CM

## 2017-11-26 DIAGNOSIS — Z125 Encounter for screening for malignant neoplasm of prostate: Secondary | ICD-10-CM

## 2017-11-26 MED ORDER — LEVOTHYROXINE SODIUM 88 MCG PO TABS: ORAL_TABLET | ORAL | 11 refills | Status: DC

## 2017-11-26 MED ORDER — SILDENAFIL CITRATE 20 MG PO TABS: ORAL_TABLET | ORAL | 0 refills | Status: DC

## 2017-11-26 NOTE — Progress Notes (Signed)
   Subjective:    Patient ID: Mason Andersen, male    DOB: November 06, 1960, 57 y.o.   MRN: [id number]  HPI  The patient comes in today for a wellness visit.    A review of their health history was completed.  A review of medications was also completed.  Any needed refills; yes  Eating habits: not too healthy  Falls/  MVA accidents in past few months:none  Regular exercise: just work  [employer]arr[employer]'s clerkpecialist pt sees on regular basis: none  Preventative health issues were discussed.   Additional concerns: reflux and belching alot  Thyroid.  Takes faithfully, prior blood work reviewed.  Generally does not miss a dose.  No symptoms of excessive fatigue no major change in weight   Uses sidenafil as needed, seems to help, some mild side effects   Quite a bit of refulux, evey evening, all day too, burping a lot, gas x helped a fair amnt. No sour taste or burning or irritatioin, bloated sensation   , feels that often   Exercise , walks a lot with work, working day and night, but not exercising reg  Self grades diet as not Review of Systems  Constitutional: Negative for activity change, appetite change and fever.  HENT: Negative for congestion and rhinorrhea.   Eyes: Negative for discharge.  Respiratory: Negative for cough and wheezing.   Cardiovascular: Negative for chest pain.  Gastrointestinal: Negative for abdominal pain, blood in stool and vomiting.  Genitourinary: Negative for difficulty urinating and frequency.  Musculoskeletal: Negative for neck pain.  Skin: Negative for rash.  Allergic/Immunologic: Negative for environmental allergies and food allergies.  Neurological: Negative for weakness and headaches.  Psychiatric/Behavioral: Negative for agitation.  All other systems reviewed and are negative.      Objective:   Physical Exam  Constitutional: He appears well-developed and well-nourished.  HENT:  Head: Normocephalic and atraumatic.  Right Ear: External ear normal.    Left Ear: External ear normal.  Nose: Nose normal.  Mouth/Throat: Oropharynx is clear and moist.  Eyes: Right eye exhibits no discharge. Left eye exhibits no discharge. No scleral icterus.  Neck: Normal range of motion. Neck supple. No thyromegaly present.  Cardiovascular: Normal rate, regular rhythm and normal heart sounds.  No murmur heard. Pulmonary/Chest: Effort normal and breath sounds normal. No respiratory distress. He has no wheezes.  Abdominal: Soft. Bowel sounds are normal. He exhibits no distension and no mass. There is no tenderness.  Genitourinary: Penis normal.  Musculoskeletal: Normal range of motion. He exhibits no edema.  Lymphadenopathy:    He has no cervical adenopathy.  Neurological: He is alert. He exhibits normal muscle tone. Coordination normal.  Skin: Skin is warm and dry. No erythema.  Psychiatric: He has a normal mood and affect. His behavior is normal. Judgment normal.  Vitals reviewed.         Assessment & Plan:  Impression 1 well adult exam.  Up-to-date on colonoscopy.  Declines flu shot.  Diet discussed exercise discussed  2.  Osteopenia last bone density test done within a year  3.  Hypothyroidism clinically stable we will evaluate TSH prior work reviewed  4.  Erectile dysfunction.  Ongoing discussed will refill medications her graph #5 reflux.  Gas-X pretty much taking care of all symptoms discussed if he reaches a point where does not would recommend Zantac 300 future  Recommendations based on blood work

## 2017-11-27 LAB — BASIC METABOLIC PANEL
BUN/Creatinine Ratio: 18 (ref 9–20)
BUN: 19 mg/dL (ref 6–24)
CALCIUM: 9.7 mg/dL (ref 8.7–10.2)
CO2: 26 mmol/L (ref 20–29)
CREATININE: 1.07 mg/dL (ref 0.76–1.27)
Chloride: 101 mmol/L (ref 96–106)
GFR calc Af Amer: 90 mL/min/{1.73_m2} (ref >59)
GFR calc non Af Amer: 78 mL/min/{1.73_m2} (ref >59)
Glucose: 104 mg/dL — ABNORMAL HIGH (ref 65–99)
Potassium: 4.6 mmol/L (ref 3.5–5.2)
SODIUM: 142 mmol/L (ref 134–144)

## 2017-11-27 LAB — HEPATIC FUNCTION PANEL
ALK PHOS: 80 IU/L (ref 39–117)
ALT: 48 IU/L — ABNORMAL HIGH (ref 0–44)
AST: 23 IU/L (ref 0–40)
Albumin: 4.5 g/dL (ref 3.5–5.5)
Bilirubin Total: 0.5 mg/dL (ref 0.0–1.2)
Bilirubin, Direct: 0.12 mg/dL (ref 0.00–0.40)
TOTAL PROTEIN: 6.9 g/dL (ref 6.0–8.5)

## 2017-11-27 LAB — TSH: TSH: 4.58 u[IU]/mL — AB (ref 0.450–4.500)

## 2017-11-27 LAB — LIPID PANEL
Chol/HDL Ratio: 4.9 ratio (ref 0.0–5.0)
Cholesterol, Total: 258 mg/dL — ABNORMAL HIGH (ref 100–199)
HDL: 53 mg/dL (ref >39)
LDL CALC: 171 mg/dL — AB (ref 0–99)
Triglycerides: 169 mg/dL — ABNORMAL HIGH (ref 0–149)
VLDL CHOLESTEROL CAL: 34 mg/dL (ref 5–40)

## 2017-11-27 LAB — PSA: PROSTATE SPECIFIC AG, SERUM: 1 ng/mL (ref 0.0–4.0)

## 2017-12-04 MED ORDER — LEVOTHYROXINE SODIUM 100 MCG PO TABS: 100.0000 ug | ORAL_TABLET | Freq: Every day | ORAL | 5 refills | Status: DC

## 2017-12-04 NOTE — Addendum Note (Signed)
Addended by: Margaretha SheffieldBROWN, Earnest Thalman S on: 12/04/2017 09:20 AM   Modules accepted: Orders

## 2018-07-01 ENCOUNTER — Other Ambulatory Visit: Payer: Self-pay | Admitting: Family Medicine

## 2018-08-01 ENCOUNTER — Other Ambulatory Visit: Payer: Self-pay | Admitting: Family Medicine

## 2018-08-22 ENCOUNTER — Other Ambulatory Visit: Payer: Self-pay | Admitting: Family Medicine

## 2018-08-23 ENCOUNTER — Telehealth: Payer: Self-pay | Admitting: Family Medicine

## 2018-08-23 ENCOUNTER — Other Ambulatory Visit: Payer: Self-pay | Admitting: *Deleted

## 2018-08-23 MED ORDER — LEVOTHYROXINE SODIUM 100 MCG PO TABS: ORAL_TABLET | ORAL | 4 refills | Status: DC

## 2018-08-23 NOTE — Telephone Encounter (Signed)
Already done

## 2018-08-23 NOTE — Telephone Encounter (Signed)
Last seen for wellness 11/26/2017.Please advise.

## 2018-08-23 NOTE — Telephone Encounter (Signed)
Patient is requesting refill on Levothroid 100 mg last filled 08/02/18 and last seen 11/26/17. He states doesn't understand why you are giving him only 15 pills and not a full prescription. He states he only seen once a year his follow up is not until  Feb.2020 [facility]

## 2018-08-23 NOTE — Telephone Encounter (Signed)
Ok give enough to last [facility] April since our mistake

## 2018-08-23 NOTE — Telephone Encounter (Signed)
Prescription sent electronically to pharmacy. Patient notified. 

## 2018-11-29 ENCOUNTER — Encounter: Payer: Self-pay | Admitting: Family Medicine

## 2018-11-29 ENCOUNTER — Ambulatory Visit (INDEPENDENT_AMBULATORY_CARE_PROVIDER_SITE_OTHER): Payer: BLUE CROSS/BLUE SHIELD | Admitting: Family Medicine

## 2018-11-29 VITALS — BP 128/88 | Ht 71.0 in | Wt 220.8 lb

## 2018-11-29 DIAGNOSIS — Z125 Encounter for screening for malignant neoplasm of prostate: Secondary | ICD-10-CM | POA: Diagnosis not present

## 2018-11-29 DIAGNOSIS — Z13220 Encounter for screening for lipoid disorders: Secondary | ICD-10-CM

## 2018-11-29 DIAGNOSIS — E039 Hypothyroidism, unspecified: Secondary | ICD-10-CM | POA: Diagnosis not present

## 2018-11-29 DIAGNOSIS — Z79899 Other long term (current) drug therapy: Secondary | ICD-10-CM

## 2018-11-29 DIAGNOSIS — N529 Male erectile dysfunction, unspecified: Secondary | ICD-10-CM

## 2018-11-29 DIAGNOSIS — Z0000 Encounter for general adult medical examination without abnormal findings: Secondary | ICD-10-CM

## 2018-11-29 DIAGNOSIS — R5383 Other fatigue: Secondary | ICD-10-CM

## 2018-11-29 MED ORDER — TADALAFIL 20 MG PO TABS: ORAL_TABLET | ORAL | 5 refills | Status: DC

## 2018-11-29 MED ORDER — LEVOTHYROXINE SODIUM 100 MCG PO TABS: ORAL_TABLET | ORAL | 5 refills | Status: DC

## 2018-11-29 NOTE — Progress Notes (Signed)
Subjective:    Patient ID: Mason Andersen, male    DOB: [birth year], 58 y.o.   MRN: [id number]  HPI The patient comes in today for a wellness visit.    A review of their health history was completed.  A review of medications was also completed.  Any needed refills; no  Eating habits: not really eating healthy  Falls/  MVA accidents in past few months: no  Regular exercise: not really-just working  Specialist pt sees on regular basis: no  Preventative health issues were discussed.   Additional concerns: arm goes numbs sometimes when sleeping  Steady at work    Results for orders placed or performed in visit on 11/26/17  Lipid panel  Result Value Ref Range   Cholesterol, Total 258 (H) 100 - 199 mg/dL   Triglycerides 409169 (H) 0 - 149 mg/dL   HDL 53 >81>39 mg/dL   VLDL Cholesterol Cal 34 5 - 40 mg/dL   LDL Calculated 191171 (H) 0 - 99 mg/dL   Chol/HDL Ratio 4.9 0.0 - 5.0 ratio  Hepatic function panel  Result Value Ref Range   Total Protein 6.9 6.0 - 8.5 g/dL   Albumin 4.5 3.5 - 5.5 g/dL   Bilirubin Total 0.5 0.0 - 1.2 mg/dL   Bilirubin, Direct 4.780.12 0.00 - 0.40 mg/dL   Alkaline Phosphatase 80 39 - 117 IU/L   AST 23 0 - 40 IU/L   ALT 48 (H) 0 - 44 IU/L  Basic metabolic panel  Result Value Ref Range   Glucose 104 (H) 65 - 99 mg/dL   BUN 19 6 - 24 mg/dL   Creatinine, Ser 2.951.07 0.76 - 1.27 mg/dL   GFR calc non Af Amer 78 >59 mL/min/1.73   GFR calc Af Amer 90 >59 mL/min/1.73   BUN/Creatinine Ratio 18 9 - 20   Sodium 142 134 - 144 mmol/L   Potassium 4.6 3.5 - 5.2 mmol/L   Chloride 101 96 - 106 mmol/L   CO2 26 20 - 29 mmol/L   Calcium 9.7 8.7 - 10.2 mg/dL  TSH  Result Value Ref Range   TSH 4.580 (H) 0.450 - 4.500 uIU/mL  PSA  Result Value Ref Range   Prostate Specific Ag, Serum 1.0 0.0 - 4.0 ng/mL   Quite a bit of walking at work   Noted a sensation in the eye   No hx of taking cialis or levitra     On the move, does a lot of actifvity   Hypothyroid takes  meds faithfull. Not missing a dose.   wiol wake up, arm over head, gets positioned and gets numb, then shakes out, then it fades   Review of Systems  Constitutional: Negative for activity change, appetite change and fever.  HENT: Negative for congestion and rhinorrhea.   Eyes: Negative for discharge.  Respiratory: Negative for cough and wheezing.   Cardiovascular: Negative for chest pain.  Gastrointestinal: Negative for abdominal pain, blood in stool and vomiting.  Genitourinary: Negative for difficulty urinating and frequency.  Musculoskeletal: Negative for neck pain.  Skin: Negative for rash.  Allergic/Immunologic: Negative for environmental allergies and food allergies.  Neurological: Negative for weakness and headaches.  Psychiatric/Behavioral: Negative for agitation.  All other systems reviewed and are negative.      Objective:   Physical Exam Vitals signs reviewed.  Constitutional:      Appearance: He is well-developed.  HENT:     Head: Normocephalic and atraumatic.     Right Ear: External ear  normal.     Left Ear: External ear normal.     Nose: Nose normal.  Eyes:     Pupils: Pupils are equal, round, and reactive to light.  Neck:     Musculoskeletal: Normal range of motion and neck supple.     Thyroid: No thyromegaly.  Cardiovascular:     Rate and Rhythm: Normal rate and regular rhythm.     Heart sounds: Normal heart sounds. No murmur.  Pulmonary:     Effort: Pulmonary effort is normal. No respiratory distress.     Breath sounds: Normal breath sounds. No wheezing.  Abdominal:     General: Bowel sounds are normal. There is no distension.     Palpations: Abdomen is soft. There is no mass.     Tenderness: There is no abdominal tenderness.  Genitourinary:    Penis: Normal.      Prostate: Normal.  Musculoskeletal: Normal range of motion.  Lymphadenopathy:     Cervical: No cervical adenopathy.  Skin:    General: Skin is warm and dry.     Findings: No erythema.    Neurological:     Mental Status: He is alert.     Motor: No abnormal muscle tone.  Psychiatric:        Behavior: Behavior normal.        Judgment: Judgment normal.      ++     Assessment & Plan:  Impression #1 wellness exam.  Colon done in 2018, due again in ten yrs.  Diet discussed.  Exercise discussed.  Vaccines discussed and encouraged.  #2 erectile dysfunction.  Unsatisfactory response to generic Viagra.  Potential side effects.  Patient would like to try Cialis.  3.  Hypothyroidism.  Current status uncertain.  4.  Osteopenia.  See prior notes  Appropriate blood work.  Further recommendations based on blood results.  Stop sildenafil.  Start Cialis.  Proper use discussed.

## 2018-11-30 LAB — HEPATIC FUNCTION PANEL
ALBUMIN: 4.5 g/dL (ref 3.8–4.9)
ALK PHOS: 77 IU/L (ref 39–117)
ALT: 113 IU/L — ABNORMAL HIGH (ref 0–44)
AST: 30 IU/L (ref 0–40)
BILIRUBIN TOTAL: 0.6 mg/dL (ref 0.0–1.2)
Bilirubin, Direct: 0.15 mg/dL (ref 0.00–0.40)
TOTAL PROTEIN: 7 g/dL (ref 6.0–8.5)

## 2018-11-30 LAB — BASIC METABOLIC PANEL
BUN/Creatinine Ratio: 14 (ref 9–20)
BUN: 14 mg/dL (ref 6–24)
CO2: 24 mmol/L (ref 20–29)
CREATININE: 1.03 mg/dL (ref 0.76–1.27)
Calcium: 9.8 mg/dL (ref 8.7–10.2)
Chloride: 101 mmol/L (ref 96–106)
GFR calc Af Amer: 94 mL/min/{1.73_m2} (ref >59)
GFR, EST NON AFRICAN AMERICAN: 81 mL/min/{1.73_m2} (ref >59)
GLUCOSE: 85 mg/dL (ref 65–99)
Potassium: 4.6 mmol/L (ref 3.5–5.2)
Sodium: 140 mmol/L (ref 134–144)

## 2018-11-30 LAB — LIPID PANEL
CHOL/HDL RATIO: 4.7 ratio (ref 0.0–5.0)
Cholesterol, Total: 239 mg/dL — ABNORMAL HIGH (ref 100–199)
HDL: 51 mg/dL (ref >39)
LDL Calculated: 146 mg/dL — ABNORMAL HIGH (ref 0–99)
TRIGLYCERIDES: 208 mg/dL — AB (ref 0–149)
VLDL Cholesterol Cal: 42 mg/dL — ABNORMAL HIGH (ref 5–40)

## 2018-11-30 LAB — PSA: Prostate Specific Ag, Serum: 1 ng/mL (ref 0.0–4.0)

## 2018-11-30 LAB — VITAMIN D 25 HYDROXY (VIT D DEFICIENCY, FRACTURES): Vit D, 25-Hydroxy: 31.1 ng/mL (ref 30.0–100.0)

## 2018-11-30 LAB — TSH: TSH: 3.26 u[IU]/mL (ref 0.450–4.500)

## 2018-12-05 ENCOUNTER — Encounter: Payer: Self-pay | Admitting: Family Medicine

## 2018-12-05 ENCOUNTER — Ambulatory Visit (INDEPENDENT_AMBULATORY_CARE_PROVIDER_SITE_OTHER): Payer: BLUE CROSS/BLUE SHIELD | Admitting: Family Medicine

## 2018-12-05 VITALS — BP 132/86 | Ht 71.0 in | Wt 221.2 lb

## 2018-12-05 DIAGNOSIS — R748 Abnormal levels of other serum enzymes: Secondary | ICD-10-CM

## 2018-12-05 NOTE — Progress Notes (Signed)
   Subjective:    Patient ID: Mason Andersen, male    DOB: 1960/10/30, 58 y.o.   MRN: [id number]  HPIpt arrives to discuss labs. Elevated liver enzymes.   Pt states no other concerns today.   Results for orders placed or performed in visit on 11/29/18  TSH  Result Value Ref Range   TSH 3.260 0.450 - 4.500 uIU/mL  VITAMIN D 25 Hydroxy (Vit-D Deficiency, Fractures)  Result Value Ref Range   Vit D, 25-Hydroxy 31.1 30.0 - 100.0 ng/mL  PSA  Result Value Ref Range   Prostate Specific Ag, Serum 1.0 0.0 - 4.0 ng/mL  Lipid panel  Result Value Ref Range   Cholesterol, Total 239 (H) 100 - 199 mg/dL   Triglycerides 993 (H) 0 - 149 mg/dL   HDL 51 >57 mg/dL   VLDL Cholesterol Cal 42 (H) 5 - 40 mg/dL   LDL Calculated 017 (H) 0 - 99 mg/dL   Chol/HDL Ratio 4.7 0.0 - 5.0 ratio  Hepatic function panel  Result Value Ref Range   Total Protein 7.0 6.0 - 8.5 g/dL   Albumin 4.5 3.8 - 4.9 g/dL   Bilirubin Total 0.6 0.0 - 1.2 mg/dL   Bilirubin, Direct 7.93 0.00 - 0.40 mg/dL   Alkaline Phosphatase 77 39 - 117 IU/L   AST 30 0 - 40 IU/L   ALT 113 (H) 0 - 44 IU/L  Basic metabolic panel  Result Value Ref Range   Glucose 85 65 - 99 mg/dL   BUN 14 6 - 24 mg/dL   Creatinine, Ser 9.03 0.76 - 1.27 mg/dL   GFR calc non Af Amer 81 >59 mL/min/1.73   GFR calc Af Amer 94 >59 mL/min/1.73   BUN/Creatinine Ratio 14 9 - 20   Sodium 140 134 - 144 mmol/L   Potassium 4.6 3.5 - 5.2 mmol/L   Chloride 101 96 - 106 mmol/L   CO2 24 20 - 29 mmol/L   Calcium 9.8 8.7 - 10.2 mg/dL              Review of Systems No headache, no major weight loss or weight gain, no chest pain no back pain abdominal pain no change in bowel habits complete ROS otherwise negative     Objective:   Physical Exam Alert and oriented, vitals reviewed and stable, NAD ENT-TM's and ext canals WNL bilat via otoscopic exam Soft palate, tonsils and post pharynx WNL via oropharyngeal exam Neck-symmetric, no masses; thyroid nonpalpable and  nontender Pulmonary-no tachypnea or accessory muscle use; Clear without wheezes via auscultation Card--no abnrml murmurs, rhythm reg and rate WNL Carotid pulses symmetric, without bruits Abdominal exam benign       Assessment & Plan:  Impression 1 elevated liver enzymes.  Rising.  Discussed.  Appropriate blood work.  Right upper quadrant ultrasound.  Patient does note at times alcohol intake somewhat excessive on the weekends.  Encouraged to cut down to 2 drinks per day max.  Further recommendations based on results  Greater than 50% of this 25 minute face to face visit was spent in counseling and discussion and coordination of care regarding the above diagnosis/diagnosies

## 2018-12-06 LAB — HEPATITIS C ANTIBODY: Hep C Virus Ab: <0.1 s/co ratio (ref 0.0–0.9)

## 2018-12-06 LAB — IRON: Iron: 112 ug/dL (ref 38–169)

## 2018-12-06 LAB — HEPATITIS B SURFACE ANTIGEN: Hepatitis B Surface Ag: NEGATIVE

## 2018-12-06 LAB — FERRITIN: FERRITIN: 269 ng/mL (ref 30–400)

## 2018-12-10 ENCOUNTER — Ambulatory Visit (HOSPITAL_COMMUNITY)
Admission: RE | Admit: 2018-12-10 | Discharge: 2018-12-10 | Disposition: A | Discharge status: Home / Self Care | Payer: BLUE CROSS/BLUE SHIELD | Source: Ambulatory Visit | Attending: Family Medicine | Admitting: Family Medicine

## 2018-12-10 DIAGNOSIS — R748 Abnormal levels of other serum enzymes: Secondary | ICD-10-CM | POA: Diagnosis not present

## 2018-12-10 DIAGNOSIS — R7989 Other specified abnormal findings of blood chemistry: Secondary | ICD-10-CM | POA: Diagnosis not present

## 2018-12-13 NOTE — Addendum Note (Signed)
Addended by: Meredith Leeds on: 12/13/2018 11:18 AM   Modules accepted: Orders

## 2019-04-07 IMAGING — US US ABDOMEN LIMITED
1 series · 14 of 25 positions shown · non-contrast
Comparison: None.

CLINICAL DATA: Elevated LFTs for 6 months.

EXAM:
ULTRASOUND ABDOMEN LIMITED RIGHT UPPER QUADRANT

[Series 1: us abdomen limited · 14 of 54 slices shown]
[im 1/54]
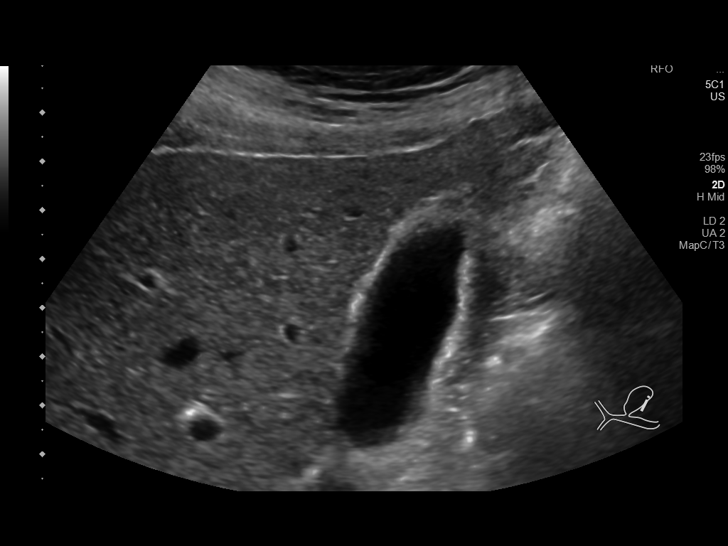
[im 5/54]
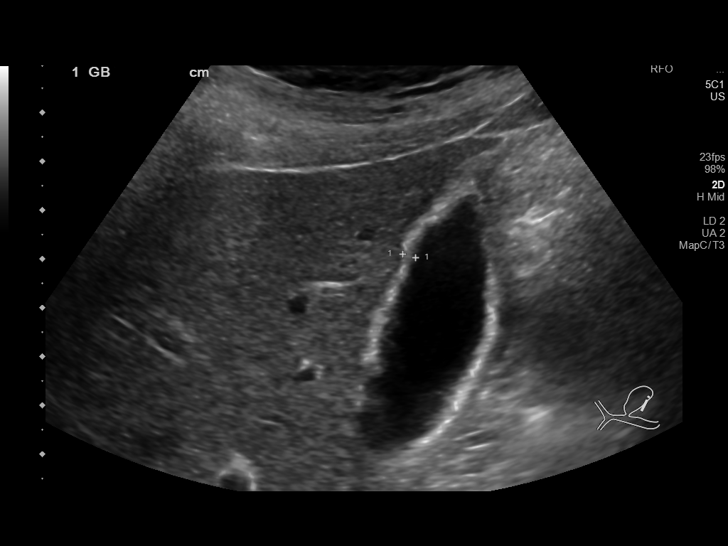
[im 9/54]
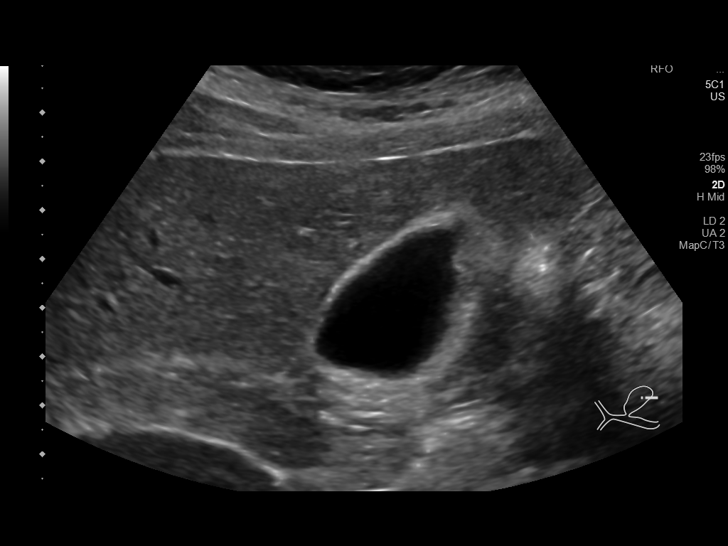
[im 14/54]
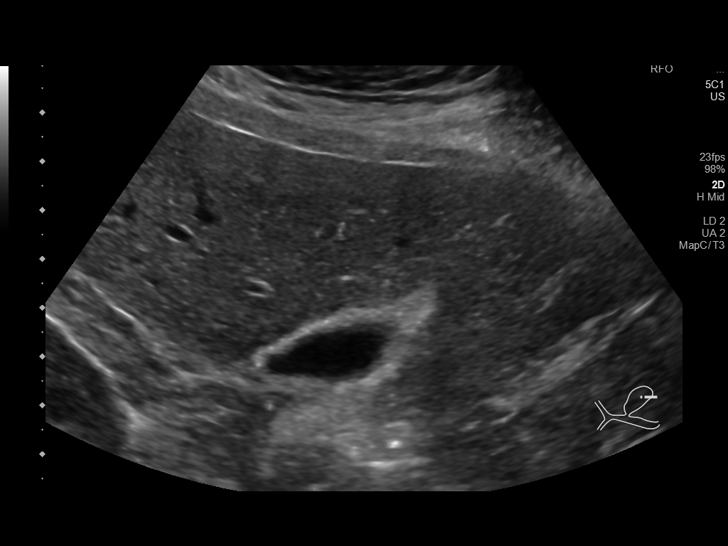
[im 18/54]
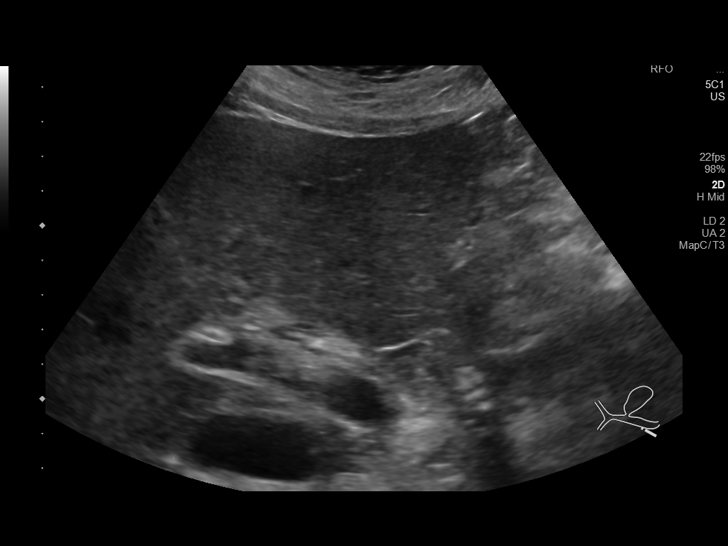
[im 20/54]
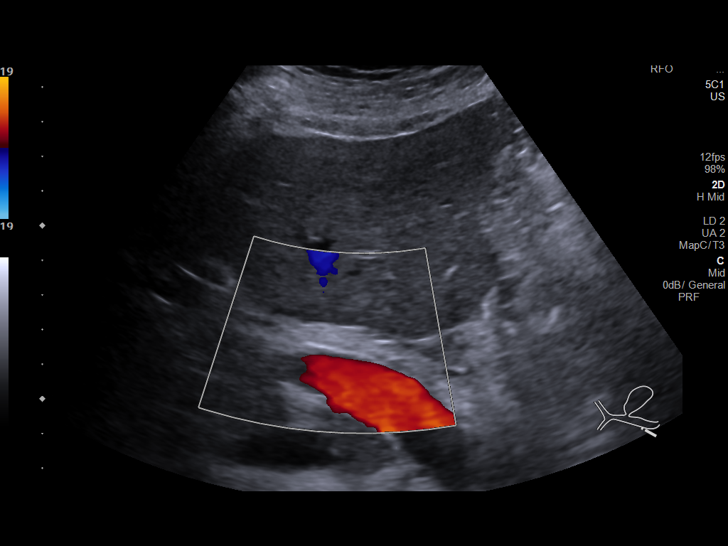
[im 25/54]
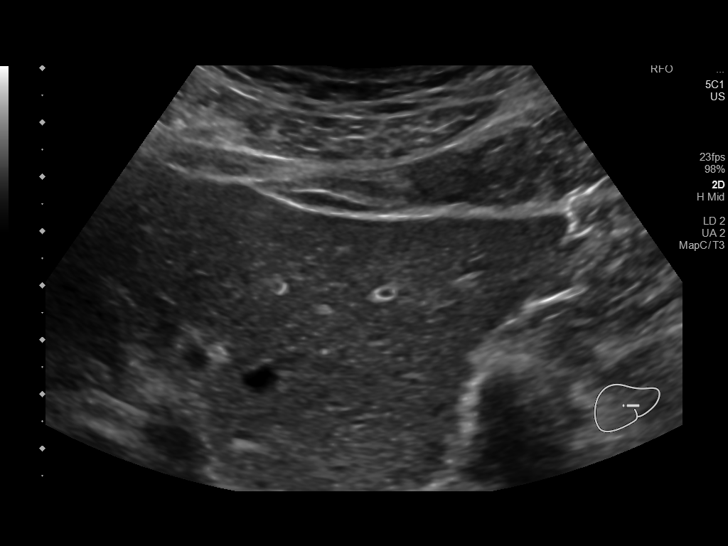
[im 29/54]
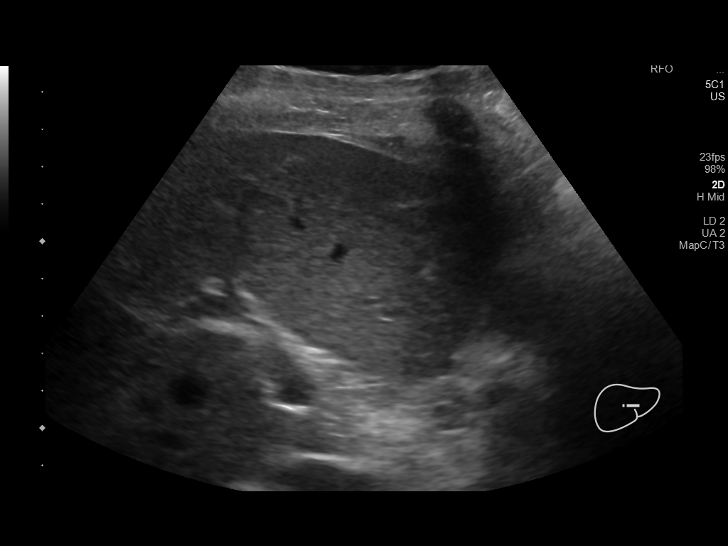
[im 34/54]
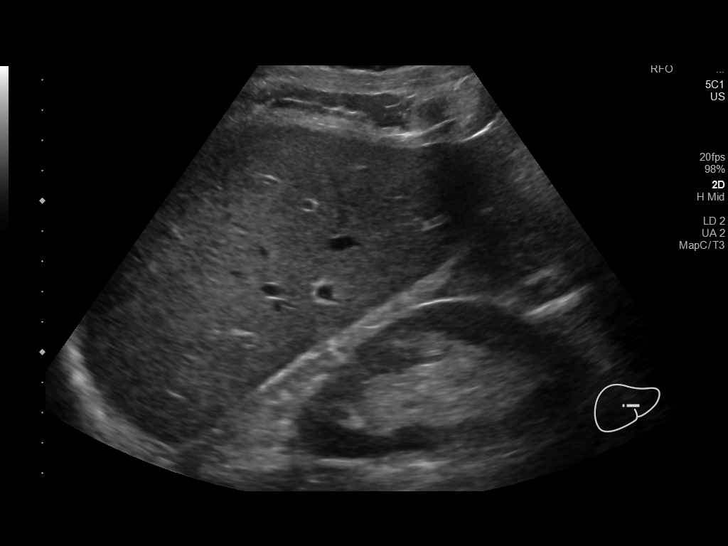
[im 36/54]
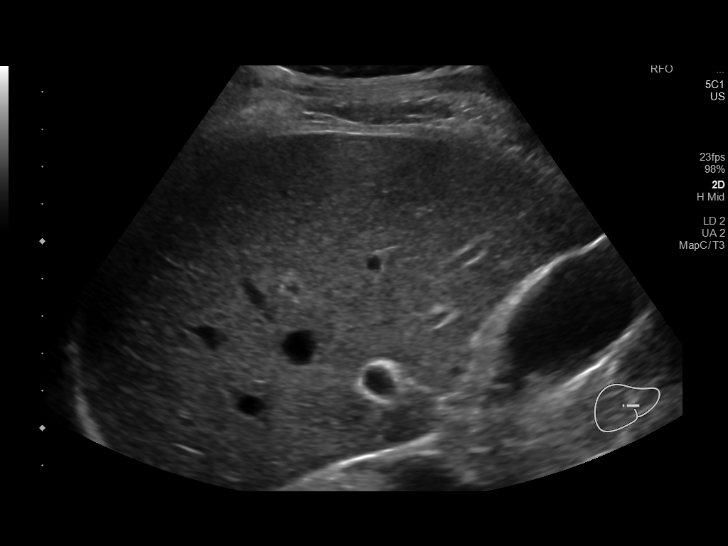
[im 40/54]
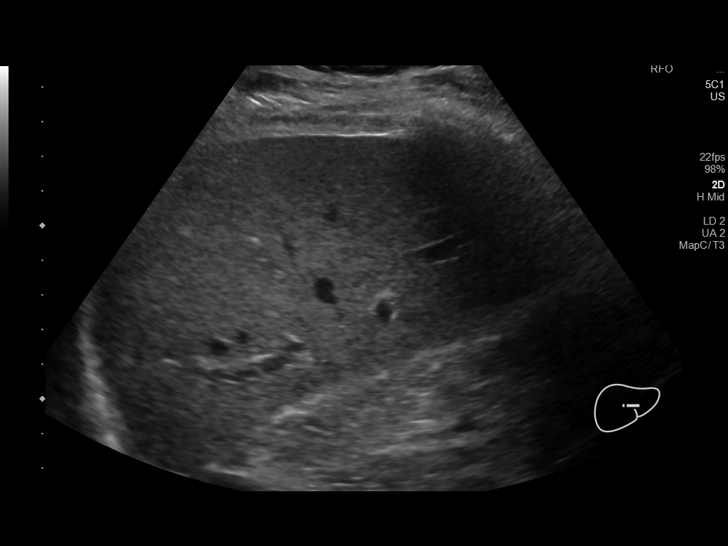
[im 45/54]
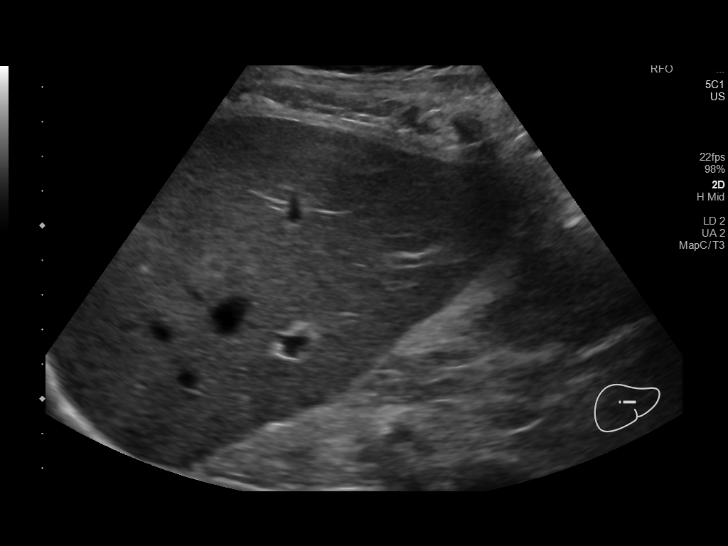
[im 49/54]
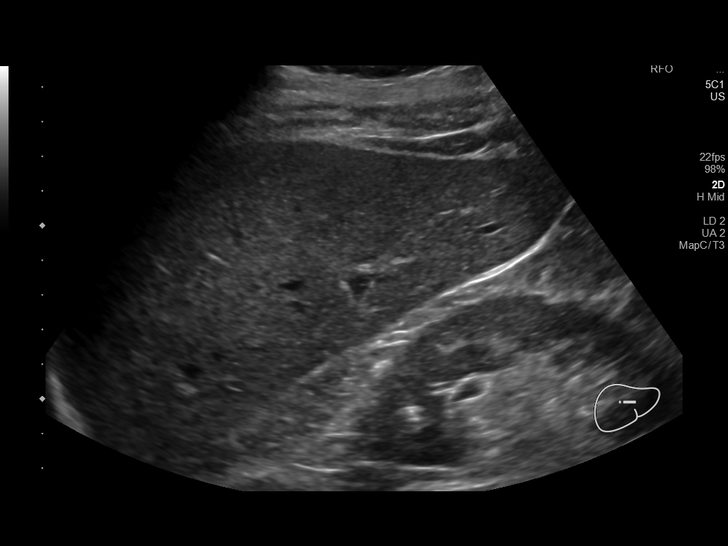
[im 54/54]
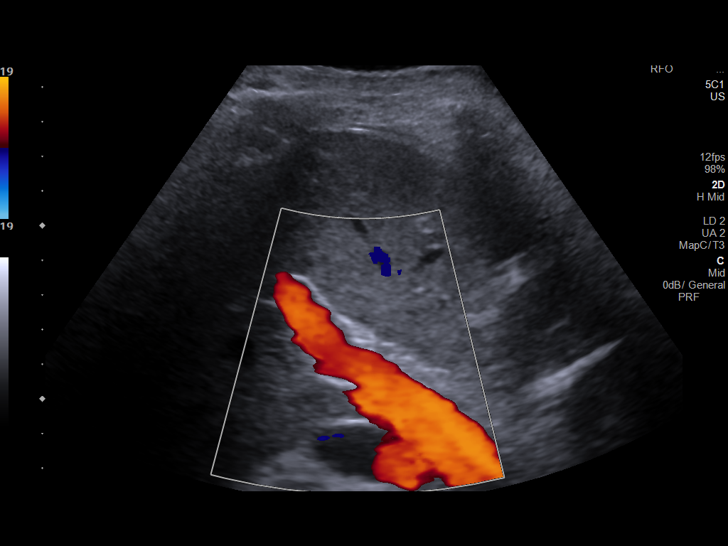

[14 of 25 positions shown; findings below may reference images not displayed]

FINDINGS: Gallbladder:

Gallbladder has a normal appearance. Gallbladder wall is
millimeters, within normal limits. No stones or pericholecystic
fluid. No sonographic Murphy's sign.

Common bile duct:

Diameter: 2.9 millimeters

Liver:

No focal lesion identified. Within normal limits in parenchymal
echogenicity. Portal vein is patent on color Doppler imaging with
normal direction of blood flow towards the liver.
IMPRESSION: Normal exam.

## 2019-08-28 ENCOUNTER — Other Ambulatory Visit: Payer: Self-pay | Admitting: Family Medicine

## 2019-11-26 ENCOUNTER — Encounter: Payer: Self-pay | Admitting: Family Medicine

## 2019-12-03 ENCOUNTER — Other Ambulatory Visit: Payer: Self-pay | Admitting: Family Medicine

## 2019-12-03 NOTE — Telephone Encounter (Signed)
CPE scheduled 3/8 does pt need lab work?

## 2019-12-03 NOTE — Telephone Encounter (Signed)
Ok one times one ref, p e or ck up in next two mo

## 2019-12-03 NOTE — Telephone Encounter (Signed)
Rep all same b w may refill times one

## 2019-12-05 ENCOUNTER — Other Ambulatory Visit: Payer: Self-pay | Admitting: *Deleted

## 2019-12-05 DIAGNOSIS — Z79899 Other long term (current) drug therapy: Secondary | ICD-10-CM

## 2019-12-05 DIAGNOSIS — Z125 Encounter for screening for malignant neoplasm of prostate: Secondary | ICD-10-CM

## 2019-12-05 DIAGNOSIS — Z13220 Encounter for screening for lipoid disorders: Secondary | ICD-10-CM

## 2019-12-05 DIAGNOSIS — E039 Hypothyroidism, unspecified: Secondary | ICD-10-CM

## 2019-12-15 DIAGNOSIS — Z79899 Other long term (current) drug therapy: Secondary | ICD-10-CM | POA: Diagnosis not present

## 2019-12-15 DIAGNOSIS — Z125 Encounter for screening for malignant neoplasm of prostate: Secondary | ICD-10-CM | POA: Diagnosis not present

## 2019-12-15 DIAGNOSIS — Z13220 Encounter for screening for lipoid disorders: Secondary | ICD-10-CM | POA: Diagnosis not present

## 2019-12-15 DIAGNOSIS — E039 Hypothyroidism, unspecified: Secondary | ICD-10-CM | POA: Diagnosis not present

## 2019-12-16 LAB — HEPATIC FUNCTION PANEL
ALT: 68 IU/L — ABNORMAL HIGH (ref 0–44)
AST: 39 IU/L (ref 0–40)
Albumin: 4.2 g/dL (ref 3.8–4.9)
Alkaline Phosphatase: 72 IU/L (ref 39–117)
Bilirubin Total: 0.6 mg/dL (ref 0.0–1.2)
Bilirubin, Direct: 0.13 mg/dL (ref 0.00–0.40)
Total Protein: 6.9 g/dL (ref 6.0–8.5)

## 2019-12-16 LAB — LIPID PANEL
Chol/HDL Ratio: 4.2 ratio (ref 0.0–5.0)
Cholesterol, Total: 240 mg/dL — ABNORMAL HIGH (ref 100–199)
HDL: 57 mg/dL (ref >39)
LDL Chol Calc (NIH): 155 mg/dL — ABNORMAL HIGH (ref 0–99)
Triglycerides: 155 mg/dL — ABNORMAL HIGH (ref 0–149)
VLDL Cholesterol Cal: 28 mg/dL (ref 5–40)

## 2019-12-16 LAB — TSH: TSH: 2.89 u[IU]/mL (ref 0.450–4.500)

## 2019-12-16 LAB — BASIC METABOLIC PANEL
BUN/Creatinine Ratio: 16 (ref 9–20)
BUN: 16 mg/dL (ref 6–24)
CO2: 22 mmol/L (ref 20–29)
Calcium: 9.3 mg/dL (ref 8.7–10.2)
Chloride: 103 mmol/L (ref 96–106)
Creatinine, Ser: 1.03 mg/dL (ref 0.76–1.27)
GFR calc Af Amer: 93 mL/min/{1.73_m2} (ref >59)
GFR calc non Af Amer: 81 mL/min/{1.73_m2} (ref >59)
Glucose: 105 mg/dL — ABNORMAL HIGH (ref 65–99)
Potassium: 4.9 mmol/L (ref 3.5–5.2)
Sodium: 140 mmol/L (ref 134–144)

## 2019-12-16 LAB — VITAMIN D 25 HYDROXY (VIT D DEFICIENCY, FRACTURES): Vit D, 25-Hydroxy: 26.7 ng/mL — ABNORMAL LOW (ref 30.0–100.0)

## 2019-12-16 LAB — PSA: Prostate Specific Ag, Serum: 1 ng/mL (ref 0.0–4.0)

## 2019-12-29 ENCOUNTER — Telehealth: Payer: Self-pay | Admitting: *Deleted

## 2019-12-29 ENCOUNTER — Other Ambulatory Visit: Payer: Self-pay

## 2019-12-29 ENCOUNTER — Ambulatory Visit (INDEPENDENT_AMBULATORY_CARE_PROVIDER_SITE_OTHER): Payer: BC Managed Care – PPO | Admitting: Family Medicine

## 2019-12-29 VITALS — BP 150/98 | Temp 98.0°F | Ht 71.5 in | Wt 217.0 lb

## 2019-12-29 DIAGNOSIS — Z0000 Encounter for general adult medical examination without abnormal findings: Secondary | ICD-10-CM | POA: Diagnosis not present

## 2019-12-29 DIAGNOSIS — I10 Essential (primary) hypertension: Secondary | ICD-10-CM

## 2019-12-29 MED ORDER — LEVOTHYROXINE SODIUM 100 MCG PO TABS: 100.0000 ug | ORAL_TABLET | Freq: Every day | ORAL | 3 refills | Status: DC

## 2019-12-29 MED ORDER — AMLODIPINE BESYLATE 5 MG PO TABS: 5.0000 mg | ORAL_TABLET | Freq: Every day | ORAL | 1 refills | Status: DC

## 2019-12-29 MED ORDER — KETOCONAZOLE 2 % EX SHAM: MEDICATED_SHAMPOO | CUTANEOUS | 0 refills | Status: DC

## 2019-12-29 NOTE — Progress Notes (Signed)
Subjective:    Patient ID: Mason Andersen, male    DOB: 1961/10/20, 59 y.o.   MRN: [id number]  HPI The patient comes in today for a wellness visit.  Results for orders placed or performed in visit on 12/05/19  Lipid panel  Result Value Ref Range   Cholesterol, Total 240 (H) 100 - 199 mg/dL   Triglycerides 154 (H) 0 - 149 mg/dL   HDL 57 >00 mg/dL   VLDL Cholesterol Cal 28 5 - 40 mg/dL   LDL Chol Calc (NIH) 867 (H) 0 - 99 mg/dL   Chol/HDL Ratio 4.2 0.0 - 5.0 ratio  Hepatic function panel  Result Value Ref Range   Total Protein 6.9 6.0 - 8.5 g/dL   Albumin 4.2 3.8 - 4.9 g/dL   Bilirubin Total 0.6 0.0 - 1.2 mg/dL   Bilirubin, Direct 6.19 0.00 - 0.40 mg/dL   Alkaline Phosphatase 72 39 - 117 IU/L   AST 39 0 - 40 IU/L   ALT 68 (H) 0 - 44 IU/L  Basic metabolic panel  Result Value Ref Range   Glucose 105 (H) 65 - 99 mg/dL   BUN 16 6 - 24 mg/dL   Creatinine, Ser 5.09 0.76 - 1.27 mg/dL   GFR calc non Af Amer 81 >59 mL/min/1.73   GFR calc Af Amer 93 >59 mL/min/1.73   BUN/Creatinine Ratio 16 9 - 20   Sodium 140 134 - 144 mmol/L   Potassium 4.9 3.5 - 5.2 mmol/L   Chloride 103 96 - 106 mmol/L   CO2 22 20 - 29 mmol/L   Calcium 9.3 8.7 - 10.2 mg/dL  PSA  Result Value Ref Range   Prostate Specific Ag, Serum 1.0 0.0 - 4.0 ng/mL  TSH  Result Value Ref Range   TSH 2.890 0.450 - 4.500 uIU/mL  VITAMIN D 25 Hydroxy (Vit-D Deficiency, Fractures)  Result Value Ref Range   Vit D, 25-Hydroxy 26.7 (L) 30.0 - 100.0 ng/mL     A review of their health history was completed.  A review of medications was also completed.  Any needed refills; update meds  Eating habits: not healthy  Falls/  MVA accidents in past few months: none  Regular exercise: none  Specialist pt sees on regular basis: none  Preventative health issues were discussed.   Additional concerns: rash on chest. Used diclofenac sodium topical gel 1% and needs a refill.  Dentist told pt his bp was elevated the last couple  of times it was checked.   bp on thi high side, not sure of b p hx fam hx, no pers hx of this   Staying active but not  Reg exercise   Review of Systems No headache, no major weight loss or weight gain, no chest pain no back pain abdominal pain no change in bowel habits complete ROS otherwise negative     Objective:   Physical Exam Vitals reviewed.  Constitutional:      Appearance: He is well-developed.  HENT:     Head: Normocephalic and atraumatic.     Right Ear: External ear normal.     Left Ear: External ear normal.     Nose: Nose normal.  Eyes:     Pupils: Pupils are equal, round, and reactive to light.  Neck:     Thyroid: No thyromegaly.  Cardiovascular:     Rate and Rhythm: Normal rate and regular rhythm.     Heart sounds: Normal heart sounds. No murmur.  Pulmonary:  Effort: Pulmonary effort is normal. No respiratory distress.     Breath sounds: Normal breath sounds. No wheezing.  Abdominal:     General: Bowel sounds are normal. There is no distension.     Palpations: Abdomen is soft. There is no mass.     Tenderness: There is no abdominal tenderness.  Genitourinary:    Penis: Normal.   Musculoskeletal:        General: Normal range of motion.     Cervical back: Normal range of motion and neck supple.  Lymphadenopathy:     Cervical: No cervical adenopathy.  Skin:    General: Skin is warm and dry.     Findings: No erythema.  Neurological:     Mental Status: He is alert.     Motor: No abnormal muscle tone.  Psychiatric:        Behavior: Behavior normal.        Judgment: Judgment normal.    Chest and back revealed tenia versicolor.  Blood pressure remains substantially elevated on repeat       Assessment & Plan:  Impression wellness exam.  Diet discussed.  Exercise discussed.  Declines flu shot, declines covid shot  2.  Essential hypertension.  Numbers elevated elsewhere multiple times and here today.  Discussed.  Will initiate medication rationale  discussed.  Salt intake discussed.  3.  Tinea versicolor prescribed medicine for this  Follow-up in 6 months.

## 2019-12-29 NOTE — Telephone Encounter (Signed)
Changed to ketoconazole shampoo 2%

## 2019-12-29 NOTE — Telephone Encounter (Signed)
yes

## 2019-12-29 NOTE — Patient Instructions (Addendum)
Results for orders placed or performed in visit on 12/05/19  Lipid panel  Result Value Ref Range   Cholesterol, Total 240 (H) 100 - 199 mg/dL   Triglycerides 509 (H) 0 - 149 mg/dL   HDL 57 >32 mg/dL   VLDL Cholesterol Cal 28 5 - 40 mg/dL   LDL Chol Calc (NIH) 671 (H) 0 - 99 mg/dL   Chol/HDL Ratio 4.2 0.0 - 5.0 ratio  Hepatic function panel  Result Value Ref Range   Total Protein 6.9 6.0 - 8.5 g/dL   Albumin 4.2 3.8 - 4.9 g/dL   Bilirubin Total 0.6 0.0 - 1.2 mg/dL   Bilirubin, Direct 2.45 0.00 - 0.40 mg/dL   Alkaline Phosphatase 72 39 - 117 IU/L   AST 39 0 - 40 IU/L   ALT 68 (H) 0 - 44 IU/L  Basic metabolic panel  Result Value Ref Range   Glucose 105 (H) 65 - 99 mg/dL   BUN 16 6 - 24 mg/dL   Creatinine, Ser 8.09 0.76 - 1.27 mg/dL   GFR calc non Af Amer 81 >59 mL/min/1.73   GFR calc Af Amer 93 >59 mL/min/1.73   BUN/Creatinine Ratio 16 9 - 20   Sodium 140 134 - 144 mmol/L   Potassium 4.9 3.5 - 5.2 mmol/L   Chloride 103 96 - 106 mmol/L   CO2 22 20 - 29 mmol/L   Calcium 9.3 8.7 - 10.2 mg/dL  PSA  Result Value Ref Range   Prostate Specific Ag, Serum 1.0 0.0 - 4.0 ng/mL  TSH  Result Value Ref Range   TSH 2.890 0.450 - 4.500 uIU/mL  VITAMIN D 25 Hydroxy (Vit-D Deficiency, Fractures)  Result Value Ref Range   Vit D, 25-Hydroxy 26.7 (L) 30.0 - 100.0 ng/mL    Omni 5 at [facility] , [employer] bp cuff car apoth   Long term goal   Systolic under 140    Diastolic under 85  ALSO please start vitamin D3 supplement over the counter, 1000 mcg daily    Hypertension, Adult High blood pressure (hypertension) is when the force of blood pumping through the arteries is too strong. The arteries are the blood vessels that carry blood from the heart throughout the body. Hypertension forces the heart to work harder to pump blood and may cause arteries to become narrow or stiff. Untreated or uncontrolled hypertension can cause a heart attack, heart failure, a stroke, kidney disease, and other  problems. A blood pressure reading consists of a higher number over a lower number. Ideally, your blood pressure should be below 120/80. The first ("top") number is called the systolic pressure. It is a measure of the pressure in your arteries as your heart beats. The second ("bottom") number is called the diastolic pressure. It is a measure of the pressure in your arteries as the heart relaxes. What are the causes? The exact cause of this condition is not known. There are some conditions that result in or are related to high blood pressure. What increases the risk? Some risk factors for high blood pressure are under your control. The following factors may make you more likely to develop this condition:  Smoking.  Having type 2 diabetes mellitus, high cholesterol, or both.  Not getting enough exercise or physical activity.  Being overweight.  Having too much fat, sugar, calories, or salt (sodium) in your diet.  Drinking too much alcohol. Some risk factors for high blood pressure may be difficult or impossible to change. Some of these factors  include:  Having chronic kidney disease.  Having a family history of high blood pressure.  Age. Risk increases with age.  Race. You may be at higher risk if you are African American.  Gender. Men are at higher risk than women before age 64. After age 13, women are at higher risk than men.  Having obstructive sleep apnea.  Stress. What are the signs or symptoms? High blood pressure may not cause symptoms. Very high blood pressure (hypertensive crisis) may cause:  Headache.  Anxiety.  Shortness of breath.  Nosebleed.  Nausea and vomiting.  Vision changes.  Severe chest pain.  Seizures. How is this diagnosed? This condition is diagnosed by measuring your blood pressure while you are seated, with your arm resting on a flat surface, your legs uncrossed, and your feet flat on the floor. The cuff of the blood pressure monitor will be  placed directly against the skin of your upper arm at the level of your heart. It should be measured at least twice using the same arm. Certain conditions can cause a difference in blood pressure between your right and left arms. Certain factors can cause blood pressure readings to be lower or higher than normal for a short period of time:  When your blood pressure is higher when you are in a health care provider's office than when you are at home, this is called white coat hypertension. Most people with this condition do not need medicines.  When your blood pressure is higher at home than when you are in a health care provider's office, this is called masked hypertension. Most people with this condition may need medicines to control blood pressure. If you have a high blood pressure reading during one visit or you have normal blood pressure with other risk factors, you may be asked to:  Return on a different day to have your blood pressure checked again.  Monitor your blood pressure at home for 1 week or longer. If you are diagnosed with hypertension, you may have other blood or imaging tests to help your health care provider understand your overall risk for other conditions. How is this treated? This condition is treated by making healthy lifestyle changes, such as eating healthy foods, exercising more, and reducing your alcohol intake. Your health care provider may prescribe medicine if lifestyle changes are not enough to get your blood pressure under control, and if:  Your systolic blood pressure is above 130.  Your diastolic blood pressure is above 80. Your personal target blood pressure may vary depending on your medical conditions, your age, and other factors. Follow these instructions at home: Eating and drinking   Eat a diet that is high in fiber and potassium, and low in sodium, added sugar, and fat. An example eating plan is called the DASH (Dietary Approaches to Stop Hypertension)  diet. To eat this way: ? Eat plenty of fresh fruits and vegetables. Try to fill one half of your plate at each meal with fruits and vegetables. ? Eat whole grains, such as whole-wheat pasta, brown rice, or whole-grain bread. Fill about one fourth of your plate with whole grains. ? Eat or drink low-fat dairy products, such as skim milk or low-fat yogurt. ? Avoid fatty cuts of meat, processed or cured meats, and poultry with skin. Fill about one fourth of your plate with lean proteins, such as fish, chicken without skin, beans, eggs, or tofu. ? Avoid pre-made and processed foods. These tend to be higher in sodium, added sugar,  and fat.  Reduce your daily sodium intake. Most people with hypertension should eat less than 1,500 mg of sodium a day.  Do not drink alcohol if: ? Your health care provider tells you not to drink. ? You are pregnant, may be pregnant, or are planning to become pregnant.  If you drink alcohol: ? Limit how much you use to:  0-1 drink a day for women.  0-2 drinks a day for men. ? Be aware of how much alcohol is in your drink. In the [place] one drink equals one 12 oz bottle of beer (355 mL), one 5 oz glass of wine (148 mL), or one 1 oz glass of hard liquor (44 mL). Lifestyle   Work with your health care provider to maintain a healthy body weight or to lose weight. Ask what an ideal weight is for you.  Get at least 30 minutes of exercise most days of the week. Activities may include walking, swimming, or biking.  Include exercise to strengthen your muscles (resistance exercise), such as Pilates or lifting weights, as part of your weekly exercise routine. Try to do these types of exercises for 30 minutes at least 3 days a week.  Do not use any products that contain nicotine or tobacco, such as cigarettes, e-cigarettes, and chewing tobacco. If you need help quitting, ask your health care provider.  Monitor your blood pressure at home as told by your health care  provider.  Keep all follow-up visits as told by your health care provider. This is important. Medicines  Take over-the-counter and prescription medicines only as told by your health care provider. Follow directions carefully. Blood pressure medicines must be taken as prescribed.  Do not skip doses of blood pressure medicine. Doing this puts you at risk for problems and can make the medicine less effective.  Ask your health care provider about side effects or reactions to medicines that you should watch for. Contact a health care provider if you:  Think you are having a reaction to a medicine you are taking.  Have headaches that keep coming back (recurring).  Feel dizzy.  Have swelling in your ankles.  Have trouble with your vision. Get help right away if you:  Develop a severe headache or confusion.  Have unusual weakness or numbness.  Feel faint.  Have severe pain in your chest or abdomen.  Vomit repeatedly.  Have trouble breathing. Summary  Hypertension is when the force of blood pumping through your arteries is too strong. If this condition is not controlled, it may put you at risk for serious complications.  Your personal target blood pressure may vary depending on your medical conditions, your age, and other factors. For most people, a normal blood pressure is less than 120/80.  Hypertension is treated with lifestyle changes, medicines, or a combination of both. Lifestyle changes include losing weight, eating a healthy, low-sodium diet, exercising more, and limiting alcohol. This information is not intended to replace advice given to you by your health care provider. Make sure you discuss any questions you have with your health care provider. Document Revised: 06/19/2018 Document Reviewed: 06/19/2018 Elsevier Patient Education  2020 [employer].

## 2019-12-29 NOTE — Telephone Encounter (Signed)
rx wrote for ketoconazole shampoo 2.25% or 2.3 or 2.5%. later into skin, leave on for 10 mins, then rinse. Do daily for 7 days. Could not find this strength in epic. I called pharm and was told it only comes in 2%. Is it ok to change to 2%?

## 2020-01-04 DIAGNOSIS — I10 Essential (primary) hypertension: Secondary | ICD-10-CM | POA: Insufficient documentation

## 2020-05-24 ENCOUNTER — Telehealth: Payer: Self-pay | Admitting: Family Medicine

## 2020-05-24 DIAGNOSIS — E039 Hypothyroidism, unspecified: Secondary | ICD-10-CM

## 2020-05-24 DIAGNOSIS — I10 Essential (primary) hypertension: Secondary | ICD-10-CM

## 2020-05-24 DIAGNOSIS — Z79899 Other long term (current) drug therapy: Secondary | ICD-10-CM

## 2020-05-24 NOTE — Telephone Encounter (Signed)
Patient called to make a f/u appt because he was told he needed to f/u in September.  Appt made for 06/23/20.  Then patient wanted to know if he needed lab work prior, then he wanted to know if he really need an appt since he isn't on that much medication??????

## 2020-05-28 NOTE — Telephone Encounter (Signed)
Labs ordered and pt is aware 

## 2020-05-28 NOTE — Addendum Note (Signed)
Addended by: Marlowe Shores on: 05/28/2020 01:38 PM   Modules accepted: Orders

## 2020-05-28 NOTE — Telephone Encounter (Signed)
Yes, needs appt to establish care, pt on 6 months schedule due to Htn and hypothyroidism.   Just order -cbc, cmp, tsh, and lipid panel.   Thx.   Dr. Ladona Ridgel

## 2020-06-10 DIAGNOSIS — Z79899 Other long term (current) drug therapy: Secondary | ICD-10-CM | POA: Diagnosis not present

## 2020-06-10 DIAGNOSIS — I10 Essential (primary) hypertension: Secondary | ICD-10-CM | POA: Diagnosis not present

## 2020-06-10 DIAGNOSIS — E039 Hypothyroidism, unspecified: Secondary | ICD-10-CM | POA: Diagnosis not present

## 2020-06-11 LAB — COMPREHENSIVE METABOLIC PANEL
ALT: 30 IU/L (ref 0–44)
AST: 20 IU/L (ref 0–40)
Albumin/Globulin Ratio: 2.1 (ref 1.2–2.2)
Albumin: 4.4 g/dL (ref 3.8–4.9)
Alkaline Phosphatase: 67 IU/L (ref 48–121)
BUN/Creatinine Ratio: 15 (ref 9–20)
BUN: 15 mg/dL (ref 6–24)
Bilirubin Total: 0.4 mg/dL (ref 0.0–1.2)
CO2: 26 mmol/L (ref 20–29)
Calcium: 9.5 mg/dL (ref 8.7–10.2)
Chloride: 101 mmol/L (ref 96–106)
Creatinine, Ser: 1.02 mg/dL (ref 0.76–1.27)
GFR calc Af Amer: 94 mL/min/{1.73_m2} (ref >59)
GFR calc non Af Amer: 81 mL/min/{1.73_m2} (ref >59)
Globulin, Total: 2.1 g/dL (ref 1.5–4.5)
Glucose: 96 mg/dL (ref 65–99)
Potassium: 4.9 mmol/L (ref 3.5–5.2)
Sodium: 138 mmol/L (ref 134–144)
Total Protein: 6.5 g/dL (ref 6.0–8.5)

## 2020-06-11 LAB — CBC WITH DIFFERENTIAL/PLATELET
Basophils Absolute: 0 10*3/uL (ref 0.0–0.2)
Basos: 1 %
EOS (ABSOLUTE): 0.1 10*3/uL (ref 0.0–0.4)
Eos: 2 %
Hematocrit: 45.1 % (ref 37.5–51.0)
Hemoglobin: 15.4 g/dL (ref 13.0–17.7)
Immature Grans (Abs): 0 10*3/uL (ref 0.0–0.1)
Immature Granulocytes: 0 %
Lymphocytes Absolute: 2 10*3/uL (ref 0.7–3.1)
Lymphs: 31 %
MCH: 31.5 pg (ref 26.6–33.0)
MCHC: 34.1 g/dL (ref 31.5–35.7)
MCV: 92 fL (ref 79–97)
Monocytes Absolute: 0.5 10*3/uL (ref 0.1–0.9)
Monocytes: 8 %
Neutrophils Absolute: 3.8 10*3/uL (ref 1.4–7.0)
Neutrophils: 58 %
Platelets: 283 10*3/uL (ref 150–450)
RBC: 4.89 x10E6/uL (ref 4.14–5.80)
RDW: 12.7 % (ref 11.6–15.4)
WBC: 6.5 10*3/uL (ref 3.4–10.8)

## 2020-06-11 LAB — LIPID PANEL
Chol/HDL Ratio: 4 ratio (ref 0.0–5.0)
Cholesterol, Total: 216 mg/dL — ABNORMAL HIGH (ref 100–199)
HDL: 54 mg/dL (ref >39)
LDL Chol Calc (NIH): 141 mg/dL — ABNORMAL HIGH (ref 0–99)
Triglycerides: 120 mg/dL (ref 0–149)
VLDL Cholesterol Cal: 21 mg/dL (ref 5–40)

## 2020-06-11 LAB — TSH: TSH: 3.31 u[IU]/mL (ref 0.450–4.500)

## 2020-06-23 ENCOUNTER — Encounter: Payer: Self-pay | Admitting: Family Medicine

## 2020-06-23 ENCOUNTER — Other Ambulatory Visit: Payer: Self-pay

## 2020-06-23 ENCOUNTER — Ambulatory Visit (INDEPENDENT_AMBULATORY_CARE_PROVIDER_SITE_OTHER): Payer: BC Managed Care – PPO | Admitting: Family Medicine

## 2020-06-23 VITALS — BP 118/74 | HR 89 | Temp 97.2°F | Ht 71.5 in | Wt 220.0 lb

## 2020-06-23 DIAGNOSIS — I10 Essential (primary) hypertension: Secondary | ICD-10-CM | POA: Diagnosis not present

## 2020-06-23 DIAGNOSIS — E039 Hypothyroidism, unspecified: Secondary | ICD-10-CM | POA: Diagnosis not present

## 2020-06-23 DIAGNOSIS — R29898 Other symptoms and signs involving the musculoskeletal system: Secondary | ICD-10-CM

## 2020-06-23 DIAGNOSIS — E785 Hyperlipidemia, unspecified: Secondary | ICD-10-CM

## 2020-06-23 MED ORDER — AMLODIPINE BESYLATE 5 MG PO TABS: 5.0000 mg | ORAL_TABLET | Freq: Every day | ORAL | 1 refills | Status: DC

## 2020-06-23 NOTE — Progress Notes (Signed)
Patient ID: Mason Andersen, male    DOB: Jul 02, 1961, 59 y.o.   MRN: [id number]   Chief Complaint  Patient presents with  . Hypertension   Subjective:    HPI  F/u labs and htn.  States no concerns today.   Cholesterol- 216.  ldl 141. hdl 54.  tsh normal. Cbc normal. Glucose normal.   Taking omega 3 pills.  occ noticing tenderness on palpation of the tip of xiphoid process. Mainly if full after eating.  No trauma to area.  But if pushes on it, can be tender.  Has been there for years.  Not enlarged.    Medical History Londell has a past medical history of H/O vitamin D deficiency, Hyperlipidemia, Hypotestosteronemia, Hypothyroidism, Osteoporosis, and Thyroid disease.   Outpatient Encounter Medications as of 06/23/2020  Medication Sig  . amLODipine (NORVASC) 5 MG tablet Take 1 tablet (5 mg total) by mouth at bedtime.  . Calcium Carb-Cholecalciferol (CALCIUM 600-D PO) Take 1 tablet by mouth daily.  Marland Kitchen levothyroxine (EUTHYROX) 100 MCG tablet Take 1 tablet (100 mcg total) by mouth daily.  . Omega-3 Fatty Acids (FISH OIL) 1000 MG CAPS Take 1,000 mg by mouth 2 (two) times daily.   . [DISCONTINUED] amLODipine (NORVASC) 5 MG tablet Take 1 tablet (5 mg total) by mouth at bedtime.  . tadalafil (CIALIS) 20 MG tablet One tablet 2 hours before sex  . [DISCONTINUED] ketoconazole (NIZORAL) 2 % shampoo Lather into skin, leave on for 10 minutes then rinse. Do daily for 7 days.   No facility-administered encounter medications on file as of 06/23/2020.     Review of Systems  Constitutional: Negative for chills and fever.  HENT: Negative for congestion, rhinorrhea and sore throat.   Respiratory: Negative for cough, shortness of breath and wheezing.   Cardiovascular: Negative for chest pain and leg swelling.  Gastrointestinal: Negative for abdominal pain, diarrhea, nausea and vomiting.  Genitourinary: Negative for dysuria and frequency.  Skin: Negative for rash.  Neurological: Negative for  dizziness, weakness and headaches.      Vitals BP 118/74   Pulse 89   Temp (!) 97.2 F (36.2 C)   Ht 5' 11.5" (1.816 m)   Wt 220 lb (99.8 kg)   SpO2 96%   BMI 30.26 kg/m   Objective:   Physical Exam Vitals reviewed.  Constitutional:      Appearance: Normal appearance.  HENT:     Head: Normocephalic.     Nose: Nose normal. No congestion.     Mouth/Throat:     Mouth: Mucous membranes are moist.     Pharynx: No oropharyngeal exudate.  Eyes:     Extraocular Movements: Extraocular movements intact.     Conjunctiva/sclera: Conjunctivae normal.     Pupils: Pupils are equal, round, and reactive to light.  Cardiovascular:     Rate and Rhythm: Normal rate and regular rhythm.     Pulses: Normal pulses.     Heart sounds: Normal heart sounds. No murmur heard.      Comments: +small protuberance at xiphoid process. No erythema, warmth.  Mild tenderness on palpation. Pulmonary:     Effort: Pulmonary effort is normal.     Breath sounds: Normal breath sounds. No wheezing, rhonchi or rales.  Musculoskeletal:        General: Normal range of motion.     Right lower leg: No edema.     Left lower leg: No edema.  Skin:    General: Skin is warm and dry.  Findings: No rash.  Neurological:     General: No focal deficit present.     Mental Status: He is alert and oriented to person, place, and time.  Psychiatric:        Mood and Affect: Mood normal.        Behavior: Behavior normal.      Assessment and Plan   1. Essential hypertension  2. Prominent xiphoid  3. Hypothyroidism, unspecified type  4. Hyperlipidemia, unspecified hyperlipidemia type   Xiphoid process- ttp, likely just prominent.  Pt declining xray at this time. Will call or rto if worsening pain, enlarging, or redness.  htn- stable, controlled, cont meds.  HLD- stable, improving.  Taking omega 3 tablet.   Hypothyroidism- stable, cont meds.  F/u 22mo or prn.

## 2020-11-01 ENCOUNTER — Telehealth: Payer: Self-pay | Admitting: Family Medicine

## 2020-11-01 DIAGNOSIS — E785 Hyperlipidemia, unspecified: Secondary | ICD-10-CM

## 2020-11-01 DIAGNOSIS — I10 Essential (primary) hypertension: Secondary | ICD-10-CM

## 2020-11-01 DIAGNOSIS — Z79899 Other long term (current) drug therapy: Secondary | ICD-10-CM

## 2020-11-01 DIAGNOSIS — E039 Hypothyroidism, unspecified: Secondary | ICD-10-CM

## 2020-11-01 NOTE — Telephone Encounter (Signed)
Blood work ordered in Epic. Patient notified. 

## 2020-11-01 NOTE — Telephone Encounter (Signed)
Patient schedule physical for march and needing labs done

## 2020-11-01 NOTE — Telephone Encounter (Signed)
Last labs 05/2020: Lipid, TSH, CMP, CBC

## 2020-11-01 NOTE — Telephone Encounter (Signed)
Yes pls reorder those. Thx. Dr. Karie Schwalbe

## 2020-12-14 DIAGNOSIS — E785 Hyperlipidemia, unspecified: Secondary | ICD-10-CM | POA: Diagnosis not present

## 2020-12-14 DIAGNOSIS — Z79899 Other long term (current) drug therapy: Secondary | ICD-10-CM | POA: Diagnosis not present

## 2020-12-14 DIAGNOSIS — I10 Essential (primary) hypertension: Secondary | ICD-10-CM | POA: Diagnosis not present

## 2020-12-14 DIAGNOSIS — E039 Hypothyroidism, unspecified: Secondary | ICD-10-CM | POA: Diagnosis not present

## 2020-12-15 LAB — CBC WITH DIFFERENTIAL/PLATELET
Basophils Absolute: 0 10*3/uL (ref 0.0–0.2)
Basos: 1 %
EOS (ABSOLUTE): 0.2 10*3/uL (ref 0.0–0.4)
Eos: 3 %
Hematocrit: 45.5 % (ref 37.5–51.0)
Hemoglobin: 15.1 g/dL (ref 13.0–17.7)
Immature Grans (Abs): 0 10*3/uL (ref 0.0–0.1)
Immature Granulocytes: 0 %
Lymphocytes Absolute: 1.8 10*3/uL (ref 0.7–3.1)
Lymphs: 35 %
MCH: 30.9 pg (ref 26.6–33.0)
MCHC: 33.2 g/dL (ref 31.5–35.7)
MCV: 93 fL (ref 79–97)
Monocytes Absolute: 0.5 10*3/uL (ref 0.1–0.9)
Monocytes: 9 %
Neutrophils Absolute: 2.5 10*3/uL (ref 1.4–7.0)
Neutrophils: 52 %
Platelets: 267 10*3/uL (ref 150–450)
RBC: 4.88 x10E6/uL (ref 4.14–5.80)
RDW: 12.9 % (ref 11.6–15.4)
WBC: 5 10*3/uL (ref 3.4–10.8)

## 2020-12-15 LAB — COMPREHENSIVE METABOLIC PANEL
ALT: 30 IU/L (ref 0–44)
AST: 24 IU/L (ref 0–40)
Albumin/Globulin Ratio: 1.8 (ref 1.2–2.2)
Albumin: 4.4 g/dL (ref 3.8–4.9)
Alkaline Phosphatase: 75 IU/L (ref 44–121)
BUN/Creatinine Ratio: 16 (ref 9–20)
BUN: 17 mg/dL (ref 6–24)
Bilirubin Total: 0.5 mg/dL (ref 0.0–1.2)
CO2: 23 mmol/L (ref 20–29)
Calcium: 9.5 mg/dL (ref 8.7–10.2)
Chloride: 102 mmol/L (ref 96–106)
Creatinine, Ser: 1.09 mg/dL (ref 0.76–1.27)
GFR calc Af Amer: 87 mL/min/{1.73_m2} (ref >59)
GFR calc non Af Amer: 75 mL/min/{1.73_m2} (ref >59)
Globulin, Total: 2.5 g/dL (ref 1.5–4.5)
Glucose: 98 mg/dL (ref 65–99)
Potassium: 4.8 mmol/L (ref 3.5–5.2)
Sodium: 139 mmol/L (ref 134–144)
Total Protein: 6.9 g/dL (ref 6.0–8.5)

## 2020-12-15 LAB — TSH: TSH: 2.37 u[IU]/mL (ref 0.450–4.500)

## 2020-12-15 LAB — LIPID PANEL
Chol/HDL Ratio: 4.2 ratio (ref 0.0–5.0)
Cholesterol, Total: 215 mg/dL — ABNORMAL HIGH (ref 100–199)
HDL: 51 mg/dL (ref >39)
LDL Chol Calc (NIH): 122 mg/dL — ABNORMAL HIGH (ref 0–99)
Triglycerides: 237 mg/dL — ABNORMAL HIGH (ref 0–149)
VLDL Cholesterol Cal: 42 mg/dL — ABNORMAL HIGH (ref 5–40)

## 2020-12-29 ENCOUNTER — Telehealth: Payer: Self-pay

## 2020-12-29 ENCOUNTER — Other Ambulatory Visit: Payer: Self-pay | Admitting: *Deleted

## 2020-12-29 ENCOUNTER — Other Ambulatory Visit: Payer: Self-pay

## 2020-12-29 ENCOUNTER — Encounter: Payer: BC Managed Care – PPO | Admitting: Family Medicine

## 2020-12-29 MED ORDER — AMLODIPINE BESYLATE 5 MG PO TABS: 5.0000 mg | ORAL_TABLET | Freq: Every day | ORAL | 0 refills | Status: DC

## 2020-12-29 MED ORDER — LEVOTHYROXINE SODIUM 100 MCG PO TABS: 100.0000 ug | ORAL_TABLET | Freq: Every day | ORAL | 0 refills | Status: DC

## 2020-12-29 NOTE — Telephone Encounter (Signed)
Pt had to reschedule Phy due to sickness needs refill on levothyroxine (EUTHYROX) 100 MCG tab, amLODipine (NORVASC) 5 MG tablet [   [id number] - [place], [place] - [address]  [address], [place] [address]   Pt call back [phone]

## 2020-12-29 NOTE — Telephone Encounter (Signed)
Refills sent per protocol and pt was notified.  

## 2021-01-20 ENCOUNTER — Ambulatory Visit (INDEPENDENT_AMBULATORY_CARE_PROVIDER_SITE_OTHER): Payer: BC Managed Care – PPO | Admitting: Family Medicine

## 2021-01-20 ENCOUNTER — Encounter: Payer: Self-pay | Admitting: Family Medicine

## 2021-01-20 ENCOUNTER — Other Ambulatory Visit: Payer: Self-pay

## 2021-01-20 VITALS — BP 110/74 | HR 64 | Temp 97.8°F | Ht 70.5 in | Wt 214.0 lb

## 2021-01-20 DIAGNOSIS — I10 Essential (primary) hypertension: Secondary | ICD-10-CM

## 2021-01-20 DIAGNOSIS — E785 Hyperlipidemia, unspecified: Secondary | ICD-10-CM

## 2021-01-20 DIAGNOSIS — E039 Hypothyroidism, unspecified: Secondary | ICD-10-CM

## 2021-01-20 DIAGNOSIS — Z0000 Encounter for general adult medical examination without abnormal findings: Secondary | ICD-10-CM | POA: Diagnosis not present

## 2021-01-20 NOTE — Progress Notes (Signed)
Patient ID: Mason Andersen, male    DOB: [birth year], 60 y.o.   MRN: [id number]   Chief Complaint  Patient presents with  . Annual Exam   Subjective:    HPI The patient comes in today for a wellness visit.  A review of their health history was completed.  A review of medications was also completed.  Any needed refills; update meds  Eating habits: not health conscious  Falls/  MVA accidents in past few months: none  Regular exercise: walking  Specialist pt sees on regular basis: none  Preventative health issues were discussed.   Additional concerns: none  Nitric oxide and started this and helping with the headaches.  Taking bp meds. Still taking 5mg  amlodipine also. Wondered if the headache was causing an elevated bp.  Not as active and eating more carbs in winter.  Medical History Mason Andersen has a past medical history of H/O vitamin D deficiency, Hyperlipidemia, Hypotestosteronemia, Hypothyroidism, Osteoporosis, and Thyroid disease.   Outpatient Encounter Medications as of 01/20/2021  Medication Sig  . amLODipine (NORVASC) 5 MG tablet Take 1 tablet (5 mg total) by mouth at bedtime.  . Calcium Carb-Cholecalciferol (CALCIUM 600-D PO) Take 1 tablet by mouth daily.  01/22/2021 levothyroxine (EUTHYROX) 100 MCG tablet Take 1 tablet (100 mcg total) by mouth daily.  . Omega-3 Fatty Acids (FISH OIL) 1000 MG CAPS Take 1,000 mg by mouth 2 (two) times daily.   Marland Kitchen OVER THE COUNTER MEDICATION Nitric oxide flow 2 daily  Vit d 1,000 iu 2 -4 per day  Calcium 600 plus vit d3 20 mcg 2 daily  . [DISCONTINUED] tadalafil (CIALIS) 20 MG tablet One tablet 2 hours before sex   No facility-administered encounter medications on file as of 01/20/2021.     Review of Systems  Constitutional: Negative for chills and fever.  HENT: Negative for congestion, rhinorrhea and sore throat.   Respiratory: Negative for cough, shortness of breath and wheezing.   Cardiovascular: Negative for chest pain and leg  swelling.  Gastrointestinal: Negative for abdominal pain, diarrhea, nausea and vomiting.  Genitourinary: Negative for dysuria and frequency.  Skin: Negative for rash.  Neurological: Positive for headaches (intermittent, improved). Negative for dizziness and weakness.     Vitals BP 110/74   Pulse 64   Temp 97.8 F (36.6 C)   Ht 5' 10.5" (1.791 m)   Wt 214 lb (97.1 kg)   SpO2 97%   BMI 30.27 kg/m   Objective:   Physical Exam Vitals reviewed.  Constitutional:      General: He is not in acute distress.    Appearance: Normal appearance. He is not ill-appearing.  HENT:     Head: Normocephalic.     Right Ear: Tympanic membrane, ear canal and external ear normal.     Left Ear: Tympanic membrane, ear canal and external ear normal.     Nose: Nose normal. No congestion or rhinorrhea.     Mouth/Throat:     Mouth: Mucous membranes are moist.     Pharynx: No oropharyngeal exudate or posterior oropharyngeal erythema.  Eyes:     Extraocular Movements: Extraocular movements intact.     Conjunctiva/sclera: Conjunctivae normal.     Pupils: Pupils are equal, round, and reactive to light.  Cardiovascular:     Rate and Rhythm: Normal rate and regular rhythm.     Pulses: Normal pulses.     Heart sounds: Normal heart sounds. No murmur heard.   Pulmonary:     Effort: Pulmonary effort  is normal. No respiratory distress.     Breath sounds: Normal breath sounds. No wheezing, rhonchi or rales.  Abdominal:     General: Abdomen is flat. Bowel sounds are normal. There is no distension.     Palpations: Abdomen is soft. There is no mass.     Tenderness: There is no abdominal tenderness. There is no guarding or rebound.     Hernia: No hernia is present.  Musculoskeletal:        General: Normal range of motion.     Cervical back: Normal range of motion.     Right lower leg: No edema.     Left lower leg: No edema.  Skin:    General: Skin is warm and dry.     Findings: No rash.  Neurological:      General: No focal deficit present.     Mental Status: He is alert and oriented to person, place, and time.     Cranial Nerves: No cranial nerve deficit.     Motor: No weakness.     Gait: Gait normal.  Psychiatric:        Mood and Affect: Mood normal.        Behavior: Behavior normal.      Assessment and Plan   1. Routine general medical examination at a health care facility  2. Hypothyroidism, unspecified type  3. Essential hypertension  4. Hyperlipidemia, unspecified hyperlipidemia type   hld- slight elevated on cholesterol and ldl, inc in triglycerides.  Cont to eat low cholesterol diet and increase in exercising.  Hypothyroid- stable. Cont meds.  htn- stable. Cont meds.  Hm- all up to date.  Return in about 6 months (around 07/22/2021).

## 2021-01-24 ENCOUNTER — Encounter: Payer: Self-pay | Admitting: Family Medicine

## 2021-03-14 DIAGNOSIS — S0501XA Injury of conjunctiva and corneal abrasion without foreign body, right eye, initial encounter: Secondary | ICD-10-CM | POA: Diagnosis not present

## 2021-04-06 ENCOUNTER — Other Ambulatory Visit: Payer: Self-pay | Admitting: Family Medicine

## 2021-09-07 ENCOUNTER — Emergency Department (HOSPITAL_COMMUNITY): Payer: BC Managed Care – PPO

## 2021-09-07 ENCOUNTER — Emergency Department (HOSPITAL_COMMUNITY)
Admission: EM | Admit: 2021-09-07 | Discharge: 2021-09-07 | Disposition: A | Discharge status: Home / Self Care | Payer: BC Managed Care – PPO | Attending: Emergency Medicine | Admitting: Emergency Medicine

## 2021-09-07 ENCOUNTER — Other Ambulatory Visit: Payer: Self-pay

## 2021-09-07 ENCOUNTER — Encounter (HOSPITAL_COMMUNITY): Payer: Self-pay

## 2021-09-07 DIAGNOSIS — W010XXA Fall on same level from slipping, tripping and stumbling without subsequent striking against object, initial encounter: Secondary | ICD-10-CM | POA: Diagnosis not present

## 2021-09-07 DIAGNOSIS — S59912A Unspecified injury of left forearm, initial encounter: Secondary | ICD-10-CM | POA: Diagnosis not present

## 2021-09-07 DIAGNOSIS — Z79899 Other long term (current) drug therapy: Secondary | ICD-10-CM | POA: Insufficient documentation

## 2021-09-07 DIAGNOSIS — S52572A Other intraarticular fracture of lower end of left radius, initial encounter for closed fracture: Secondary | ICD-10-CM | POA: Diagnosis not present

## 2021-09-07 DIAGNOSIS — S52502A Unspecified fracture of the lower end of left radius, initial encounter for closed fracture: Secondary | ICD-10-CM | POA: Diagnosis not present

## 2021-09-07 DIAGNOSIS — M7989 Other specified soft tissue disorders: Secondary | ICD-10-CM | POA: Diagnosis not present

## 2021-09-07 DIAGNOSIS — I10 Essential (primary) hypertension: Secondary | ICD-10-CM | POA: Insufficient documentation

## 2021-09-07 DIAGNOSIS — E039 Hypothyroidism, unspecified: Secondary | ICD-10-CM | POA: Insufficient documentation

## 2021-09-07 MED ORDER — OXYCODONE-ACETAMINOPHEN 5-325 MG PO TABS: 1.0000 | ORAL_TABLET | Freq: Three times a day (TID) | ORAL | 0 refills | Status: DC | PRN

## 2021-09-07 MED ORDER — OXYCODONE-ACETAMINOPHEN 5-325 MG PO TABS
1.0000 | ORAL_TABLET | Freq: Once | ORAL | Status: AC
  Administered 2021-09-07: 1 via ORAL
  Filled 2021-09-07: qty 1

## 2021-09-07 NOTE — ED Triage Notes (Signed)
Pov from home. Tripped and fell. Pain, swelling, and deformity to left wrist.

## 2021-09-08 NOTE — ED Provider Notes (Signed)
[facility] EMERGENCY DEPARTMENT Provider Note   CSN: [id number] Arrival date & time: 09/07/21  1942     History Chief Complaint  Patient presents with   Arm Injury    Left     Mason Andersen is a 60 y.o. male.   Arm Injury Patient with left wrist injury after fall.  Patient tripped and fell.  Pain on the left wrist.  Not hit head.  Not on anticoagulation.  No elbow pain.  Hand does feel little tingly.  No neck pain.    Past Medical History:  Diagnosis Date   H/O vitamin D deficiency    Hyperlipidemia    Hypotestosteronemia    Hypothyroidism    Osteoporosis    Thyroid disease     Patient Active Problem List   Diagnosis Date Noted   Essential hypertension 01/04/2020   Special screening for malignant neoplasms, colon    Erectile dysfunction 11/27/2016   Osteoporosis 11/16/2013   Low testosterone 11/16/2013   Hypothyroidism 11/16/2013    Past Surgical History:  Procedure Laterality Date   COLONOSCOPY N/A 06/18/2017   Procedure: COLONOSCOPY;  Surgeon: West Bali, MD;  Location: [facility];  Service: Endoscopy;  Laterality: N/A;  1245   TONSILLECTOMY         Family History  Problem Relation Age of Onset   Cancer Father        bladder   Cancer Brother        bladder   Colon cancer Neg Hx     Social History   Tobacco Use   Smoking status: Never   Smokeless tobacco: Never  Vaping Use   Vaping Use: Never used  Substance Use Topics   Alcohol use: Yes    Alcohol/week: 3.0 standard drinks    Types: 3 Shots of liquor per week   Drug use: No    Home Medications Prior to Admission medications   Medication Sig Start Date End Date Taking? Authorizing Provider  amLODipine (NORVASC) 5 MG tablet TAKE 1 TABLET BY MOUTH AT BEDTIME 04/07/21  Yes Ladona Ridgel, Malena M, DO  Calcium Carb-Cholecalciferol (CALCIUM 600-D PO) Take 1 tablet by mouth daily.   Yes [provider]  EUTHYROX 100 MCG tablet Take 1 tablet by mouth once daily 04/07/21  Yes Taylor,  Malena M, DO  Omega-3 Fatty Acids (FISH OIL) 1000 MG CAPS Take 1,000 mg by mouth 2 (two) times daily.    Yes [provider]  OVER THE COUNTER MEDICATION Nitric oxide flow 2 daily  Vit d 1,000 iu 2 -4 per day  Calcium 600 plus vit d3 20 mcg 2 daily   Yes [provider]  oxyCODONE-acetaminophen (PERCOCET/ROXICET) 5-325 MG tablet Take 1-2 tablets by mouth every 8 (eight) hours as needed for severe pain. 09/07/21  Yes Benjiman Core, MD    Allergies    Sildenafil, Augmentin [amoxicillin-pot clavulanate], and Promethazine  Review of Systems   Review of Systems  Constitutional:  Negative for appetite change.  Musculoskeletal:        Left wrist pain and swelling.  Skin:  Negative for wound.  Neurological:  Negative for weakness.   Physical Exam Updated Vital Signs BP 135/85   Pulse 85   Temp 98 F (36.7 C) (Oral)   Resp 18   Ht 5\' 11"  (1.803 m)   Wt 102.1 kg   SpO2 100%   BMI 31.38 kg/m   Physical Exam Vitals and nursing note reviewed.  HENT:  Head: Atraumatic.  Cardiovascular:     Rate and Rhythm: Regular rhythm.  Pulmonary:     Effort: No respiratory distress.  Musculoskeletal:     Cervical back: Neck supple.     Comments: Tenderness and deformity to left wrist.  Sensation grossly intact to left hand except for some paresthesias over the left thumb area.  Good capillary refill.  Skin intact.  Skin:    Findings: No bruising.  Neurological:     Mental Status: He is alert and oriented to person, place, and time.    ED Results / Procedures / Treatments   Labs (all labs ordered are listed, but only abnormal results are displayed) Labs Reviewed - No data to display  EKG None  Radiology No results found.  Procedures Procedures   Medications Ordered in ED Medications  oxyCODONE-acetaminophen (PERCOCET/ROXICET) 5-325 MG per tablet 1 tablet (1 tablet Oral Given 09/07/21 2205)    ED Course  I have reviewed the triage vital signs and the  nursing notes.  Pertinent labs & imaging results that were available during my care of the patient were reviewed by me and considered in my medical decision making (see chart for details).    MDM Rules/Calculators/A&P                           Patient with fall.  Intra-articular distal radius and ulnar styloid fracture.  Discussed with Dr. Janee Morn from hand surgery.  He reviewed the x-rays.  Sugar-tong and follow-up.  Office will call tomorrow.  Is not need reduction at this time since will need surgery and likely very unstable. Final Clinical Impression(s) / ED Diagnoses Final diagnoses:  Closed fracture of distal end of left radius, unspecified fracture morphology, initial encounter    Rx / DC Orders ED Discharge Orders          Ordered    oxyCODONE-acetaminophen (PERCOCET/ROXICET) 5-325 MG tablet  Every 8 hours PRN        09/07/21 2222             Benjiman Core, MD 09/08/21 1345

## 2021-09-13 DIAGNOSIS — S52572A Other intraarticular fracture of lower end of left radius, initial encounter for closed fracture: Secondary | ICD-10-CM | POA: Diagnosis not present

## 2021-09-14 DIAGNOSIS — S52502A Unspecified fracture of the lower end of left radius, initial encounter for closed fracture: Secondary | ICD-10-CM | POA: Diagnosis not present

## 2021-09-14 DIAGNOSIS — S52572A Other intraarticular fracture of lower end of left radius, initial encounter for closed fracture: Secondary | ICD-10-CM | POA: Diagnosis not present

## 2021-09-14 DIAGNOSIS — X58XXXA Exposure to other specified factors, initial encounter: Secondary | ICD-10-CM | POA: Diagnosis not present

## 2021-09-14 DIAGNOSIS — Y999 Unspecified external cause status: Secondary | ICD-10-CM | POA: Diagnosis not present

## 2021-09-26 ENCOUNTER — Telehealth: Payer: Self-pay | Admitting: Family Medicine

## 2021-09-29 DIAGNOSIS — S52572A Other intraarticular fracture of lower end of left radius, initial encounter for closed fracture: Secondary | ICD-10-CM | POA: Diagnosis not present

## 2021-09-29 DIAGNOSIS — S52592D Other fractures of lower end of left radius, subsequent encounter for closed fracture with routine healing: Secondary | ICD-10-CM | POA: Diagnosis not present

## 2021-09-29 DIAGNOSIS — Z4889 Encounter for other specified surgical aftercare: Secondary | ICD-10-CM | POA: Diagnosis not present

## 2021-09-29 DIAGNOSIS — Z9889 Other specified postprocedural states: Secondary | ICD-10-CM | POA: Diagnosis not present

## 2021-10-03 DIAGNOSIS — S52592D Other fractures of lower end of left radius, subsequent encounter for closed fracture with routine healing: Secondary | ICD-10-CM | POA: Diagnosis not present

## 2021-10-03 DIAGNOSIS — Z4889 Encounter for other specified surgical aftercare: Secondary | ICD-10-CM | POA: Diagnosis not present

## 2021-10-07 NOTE — Telephone Encounter (Signed)
Patient called in checking the status of his refill request. He states that he will come in when it is time for his physical which he did make an appt for on  01/23/22. He doesn't see the need to come in before then since it will cost him money.  CB# [phone]

## 2021-10-10 ENCOUNTER — Other Ambulatory Visit: Payer: Self-pay

## 2021-10-10 ENCOUNTER — Ambulatory Visit (INDEPENDENT_AMBULATORY_CARE_PROVIDER_SITE_OTHER): Payer: BC Managed Care – PPO | Admitting: Family Medicine

## 2021-10-10 VITALS — BP 132/74 | HR 88 | Temp 98.4°F | Ht 71.0 in | Wt 222.0 lb

## 2021-10-10 DIAGNOSIS — E039 Hypothyroidism, unspecified: Secondary | ICD-10-CM

## 2021-10-10 DIAGNOSIS — I10 Essential (primary) hypertension: Secondary | ICD-10-CM | POA: Diagnosis not present

## 2021-10-10 DIAGNOSIS — E782 Mixed hyperlipidemia: Secondary | ICD-10-CM | POA: Diagnosis not present

## 2021-10-10 DIAGNOSIS — M8580 Other specified disorders of bone density and structure, unspecified site: Secondary | ICD-10-CM | POA: Insufficient documentation

## 2021-10-10 MED ORDER — LEVOTHYROXINE SODIUM 100 MCG PO TABS: 100.0000 ug | ORAL_TABLET | Freq: Every day | ORAL | 3 refills | Status: DC

## 2021-10-10 MED ORDER — AMLODIPINE BESYLATE 5 MG PO TABS: 5.0000 mg | ORAL_TABLET | Freq: Every day | ORAL | 3 refills | Status: DC

## 2021-10-10 NOTE — Assessment & Plan Note (Signed)
BP is stable.  Continue amlodipine.  Refilled today.  Declines lab work today.

## 2021-10-10 NOTE — Telephone Encounter (Signed)
Patient has appointment on 10/10/21

## 2021-10-10 NOTE — Patient Instructions (Signed)
Continue your current medications.  Follow up next year for your physical.  Take care  Dr. Adriana Simas

## 2021-10-10 NOTE — Assessment & Plan Note (Signed)
Intermediate risk per 10-year ASCVD risk.  We will plan on repeating labs next year and will discuss lipid-lowering therapy at that time.

## 2021-10-10 NOTE — Progress Notes (Signed)
Subjective:  Patient ID: Mason Andersen, male    DOB: [birth year]  Age: 60 y.o. MRN: [id number]  CC: Chief Complaint  Patient presents with   Hypertension    HPI:  60 year old male with hypertension, hyperlipidemia, hypothyroidism presents for follow-up.  Patient states that he is doing well other than the fact that he is recovering from a broken wrist.  Hypertension BP has been well controlled.  He is tolerating amlodipine without difficulty.  No reported side effects.  Needs refills.  Hypothyroidism Stable.  Last TSH was 2.370 in February. He is compliant with Synthroid 100 MCG daily.  Hyperlipidemia Lipids not at goal (triglycerides elevated at 237 and LDL 122; February 2022). He is currently taking fish oil.  No other medications. 10 year ASCVD risk score, 9.5%.  Patient Active Problem List   Diagnosis Date Noted   Osteopenia 10/10/2021   Mixed hyperlipidemia 10/10/2021   Essential hypertension 01/04/2020   Erectile dysfunction 11/27/2016   Low testosterone 11/16/2013   Hypothyroidism 11/16/2013    Social Hx   Social History   Socioeconomic History   Marital status: Single    Spouse name: Not on file   Number of children: Not on file   Years of education: Not on file   Highest education level: Not on file  Occupational History   Not on file  Tobacco Use   Smoking status: Never   Smokeless tobacco: Never  Vaping Use   Vaping Use: Never used  Substance and Sexual Activity   Alcohol use: Yes    Alcohol/week: 3.0 standard drinks    Types: 3 Shots of liquor per week   Drug use: No   Sexual activity: Not on file  Other Topics Concern   Not on file  Social History Narrative   Not on file   Social Determinants of Health   Financial Resource Strain: Not on file  Food Insecurity: Not on file  Transportation Needs: Not on file  Physical Activity: Not on file  Stress: Not on file  Social Connections: Not on file    Review of Systems  Constitutional:  Negative.   Respiratory: Negative.    Cardiovascular: Negative.     Objective:  BP 132/74    Pulse 88    Temp 98.4 F (36.9 C)    Ht 5\' 11"  (1.803 m)    Wt 222 lb (100.7 kg)    SpO2 99%    BMI 30.96 kg/m   BP/Weight 10/10/2021 09/07/2021 01/20/2021  Systolic BP 132 135 110  Diastolic BP 74 85 74  Wt. (Lbs) 222 225 214  BMI 30.96 31.38 30.27    Physical Exam Vitals and nursing note reviewed.  Constitutional:      General: He is not in acute distress.    Appearance: Normal appearance.  HENT:     Head: Normocephalic and atraumatic.  Eyes:     General:        Right eye: No discharge.        Left eye: No discharge.     Conjunctiva/sclera: Conjunctivae normal.  Cardiovascular:     Rate and Rhythm: Normal rate and regular rhythm.  Pulmonary:     Effort: Pulmonary effort is normal.     Breath sounds: Normal breath sounds. No wheezing, rhonchi or rales.  Abdominal:     General: There is no distension.     Palpations: Abdomen is soft.     Tenderness: There is no abdominal tenderness.  Neurological:  Mental Status: He is alert.  Psychiatric:        Mood and Affect: Mood normal.        Behavior: Behavior normal.    Lab Results  Component Value Date   WBC 5.0 12/14/2020   HGB 15.1 12/14/2020   HCT 45.5 12/14/2020   PLT 267 12/14/2020   GLUCOSE 98 12/14/2020   CHOL 215 (H) 12/14/2020   TRIG 237 (H) 12/14/2020   HDL 51 12/14/2020   LDLCALC 122 (H) 12/14/2020   ALT 30 12/14/2020   AST 24 12/14/2020   NA 139 12/14/2020   K 4.8 12/14/2020   CL 102 12/14/2020   CREATININE 1.09 12/14/2020   BUN 17 12/14/2020   CO2 23 12/14/2020   TSH 2.370 12/14/2020   PSA 0.72 11/16/2014     Assessment & Plan:   Problem List Items Addressed This Visit       Cardiovascular and Mediastinum   Essential hypertension - Primary    BP is stable.  Continue amlodipine.  Refilled today.  Declines lab work today.      Relevant Medications   amLODipine (NORVASC) 5 MG tablet      Endocrine   Hypothyroidism    Stable on 100 MCG of Synthroid.  Continue.  Refilled today.  Declines laboratory studies today.      Relevant Medications   levothyroxine (SYNTHROID) 100 MCG tablet     Other   Mixed hyperlipidemia    Intermediate risk per 10-year ASCVD risk.  We will plan on repeating labs next year and will discuss lipid-lowering therapy at that time.      Relevant Medications   amLODipine (NORVASC) 5 MG tablet     Meds ordered this encounter  Medications   amLODipine (NORVASC) 5 MG tablet    Sig: Take 1 tablet (5 mg total) by mouth daily.    Dispense:  90 tablet    Refill:  3   levothyroxine (SYNTHROID) 100 MCG tablet    Sig: Take 1 tablet (100 mcg total) by mouth daily.    Dispense:  90 tablet    Refill:  3    Follow-up:  Next year for physical  Everlene Other DO [facility]

## 2021-10-10 NOTE — Assessment & Plan Note (Signed)
Stable on 100 MCG of Synthroid.  Continue.  Refilled today.  Declines laboratory studies today.

## 2021-10-11 DIAGNOSIS — Z4889 Encounter for other specified surgical aftercare: Secondary | ICD-10-CM | POA: Diagnosis not present

## 2021-10-11 DIAGNOSIS — S52592D Other fractures of lower end of left radius, subsequent encounter for closed fracture with routine healing: Secondary | ICD-10-CM | POA: Diagnosis not present

## 2021-10-13 DIAGNOSIS — Z4889 Encounter for other specified surgical aftercare: Secondary | ICD-10-CM | POA: Diagnosis not present

## 2021-10-13 DIAGNOSIS — S52592D Other fractures of lower end of left radius, subsequent encounter for closed fracture with routine healing: Secondary | ICD-10-CM | POA: Diagnosis not present

## 2021-10-19 DIAGNOSIS — S52592D Other fractures of lower end of left radius, subsequent encounter for closed fracture with routine healing: Secondary | ICD-10-CM | POA: Diagnosis not present

## 2021-10-19 DIAGNOSIS — Z4889 Encounter for other specified surgical aftercare: Secondary | ICD-10-CM | POA: Diagnosis not present

## 2021-10-21 DIAGNOSIS — S52592D Other fractures of lower end of left radius, subsequent encounter for closed fracture with routine healing: Secondary | ICD-10-CM | POA: Diagnosis not present

## 2021-10-21 DIAGNOSIS — Z4889 Encounter for other specified surgical aftercare: Secondary | ICD-10-CM | POA: Diagnosis not present

## 2021-10-25 DIAGNOSIS — Z4889 Encounter for other specified surgical aftercare: Secondary | ICD-10-CM | POA: Diagnosis not present

## 2021-10-25 DIAGNOSIS — S52592D Other fractures of lower end of left radius, subsequent encounter for closed fracture with routine healing: Secondary | ICD-10-CM | POA: Diagnosis not present

## 2021-10-27 DIAGNOSIS — Z4889 Encounter for other specified surgical aftercare: Secondary | ICD-10-CM | POA: Diagnosis not present

## 2021-10-27 DIAGNOSIS — S52592D Other fractures of lower end of left radius, subsequent encounter for closed fracture with routine healing: Secondary | ICD-10-CM | POA: Diagnosis not present

## 2021-11-01 DIAGNOSIS — S52572A Other intraarticular fracture of lower end of left radius, initial encounter for closed fracture: Secondary | ICD-10-CM | POA: Diagnosis not present

## 2021-11-01 DIAGNOSIS — Z9889 Other specified postprocedural states: Secondary | ICD-10-CM | POA: Diagnosis not present

## 2021-11-01 DIAGNOSIS — S52592D Other fractures of lower end of left radius, subsequent encounter for closed fracture with routine healing: Secondary | ICD-10-CM | POA: Diagnosis not present

## 2021-11-01 DIAGNOSIS — Z4889 Encounter for other specified surgical aftercare: Secondary | ICD-10-CM | POA: Diagnosis not present

## 2021-11-03 DIAGNOSIS — Z4889 Encounter for other specified surgical aftercare: Secondary | ICD-10-CM | POA: Diagnosis not present

## 2021-11-03 DIAGNOSIS — S52592D Other fractures of lower end of left radius, subsequent encounter for closed fracture with routine healing: Secondary | ICD-10-CM | POA: Diagnosis not present

## 2021-11-08 DIAGNOSIS — Z4889 Encounter for other specified surgical aftercare: Secondary | ICD-10-CM | POA: Diagnosis not present

## 2021-11-08 DIAGNOSIS — S52592D Other fractures of lower end of left radius, subsequent encounter for closed fracture with routine healing: Secondary | ICD-10-CM | POA: Diagnosis not present

## 2021-11-11 DIAGNOSIS — S52592D Other fractures of lower end of left radius, subsequent encounter for closed fracture with routine healing: Secondary | ICD-10-CM | POA: Diagnosis not present

## 2021-11-11 DIAGNOSIS — Z4889 Encounter for other specified surgical aftercare: Secondary | ICD-10-CM | POA: Diagnosis not present

## 2021-11-15 DIAGNOSIS — S52592D Other fractures of lower end of left radius, subsequent encounter for closed fracture with routine healing: Secondary | ICD-10-CM | POA: Diagnosis not present

## 2021-11-15 DIAGNOSIS — Z4889 Encounter for other specified surgical aftercare: Secondary | ICD-10-CM | POA: Diagnosis not present

## 2021-11-17 DIAGNOSIS — Z4889 Encounter for other specified surgical aftercare: Secondary | ICD-10-CM | POA: Diagnosis not present

## 2021-11-17 DIAGNOSIS — S52592D Other fractures of lower end of left radius, subsequent encounter for closed fracture with routine healing: Secondary | ICD-10-CM | POA: Diagnosis not present

## 2021-11-22 DIAGNOSIS — S52592D Other fractures of lower end of left radius, subsequent encounter for closed fracture with routine healing: Secondary | ICD-10-CM | POA: Diagnosis not present

## 2021-11-22 DIAGNOSIS — Z4889 Encounter for other specified surgical aftercare: Secondary | ICD-10-CM | POA: Diagnosis not present

## 2021-11-24 DIAGNOSIS — Z4889 Encounter for other specified surgical aftercare: Secondary | ICD-10-CM | POA: Diagnosis not present

## 2021-11-24 DIAGNOSIS — S52592D Other fractures of lower end of left radius, subsequent encounter for closed fracture with routine healing: Secondary | ICD-10-CM | POA: Diagnosis not present

## 2021-11-29 DIAGNOSIS — Z9889 Other specified postprocedural states: Secondary | ICD-10-CM | POA: Diagnosis not present

## 2021-11-29 DIAGNOSIS — S52572A Other intraarticular fracture of lower end of left radius, initial encounter for closed fracture: Secondary | ICD-10-CM | POA: Diagnosis not present

## 2021-11-29 DIAGNOSIS — S52592D Other fractures of lower end of left radius, subsequent encounter for closed fracture with routine healing: Secondary | ICD-10-CM | POA: Diagnosis not present

## 2021-11-29 DIAGNOSIS — Z4889 Encounter for other specified surgical aftercare: Secondary | ICD-10-CM | POA: Diagnosis not present

## 2021-12-01 DIAGNOSIS — Z4889 Encounter for other specified surgical aftercare: Secondary | ICD-10-CM | POA: Diagnosis not present

## 2021-12-01 DIAGNOSIS — S52592D Other fractures of lower end of left radius, subsequent encounter for closed fracture with routine healing: Secondary | ICD-10-CM | POA: Diagnosis not present

## 2021-12-27 DIAGNOSIS — R200 Anesthesia of skin: Secondary | ICD-10-CM | POA: Diagnosis not present

## 2021-12-27 DIAGNOSIS — S52592D Other fractures of lower end of left radius, subsequent encounter for closed fracture with routine healing: Secondary | ICD-10-CM | POA: Diagnosis not present

## 2021-12-27 DIAGNOSIS — Z4889 Encounter for other specified surgical aftercare: Secondary | ICD-10-CM | POA: Diagnosis not present

## 2021-12-27 DIAGNOSIS — S52572D Other intraarticular fracture of lower end of left radius, subsequent encounter for closed fracture with routine healing: Secondary | ICD-10-CM | POA: Diagnosis not present

## 2022-01-03 IMAGING — DX DG WRIST COMPLETE 3+V*L*
3 series · 3 of 3 positions shown · non-contrast
Comparison: None.

CLINICAL DATA: Deformity.  Fall.

EXAM:
LEFT WRIST - COMPLETE 3+ VIEW

[wrist ap]
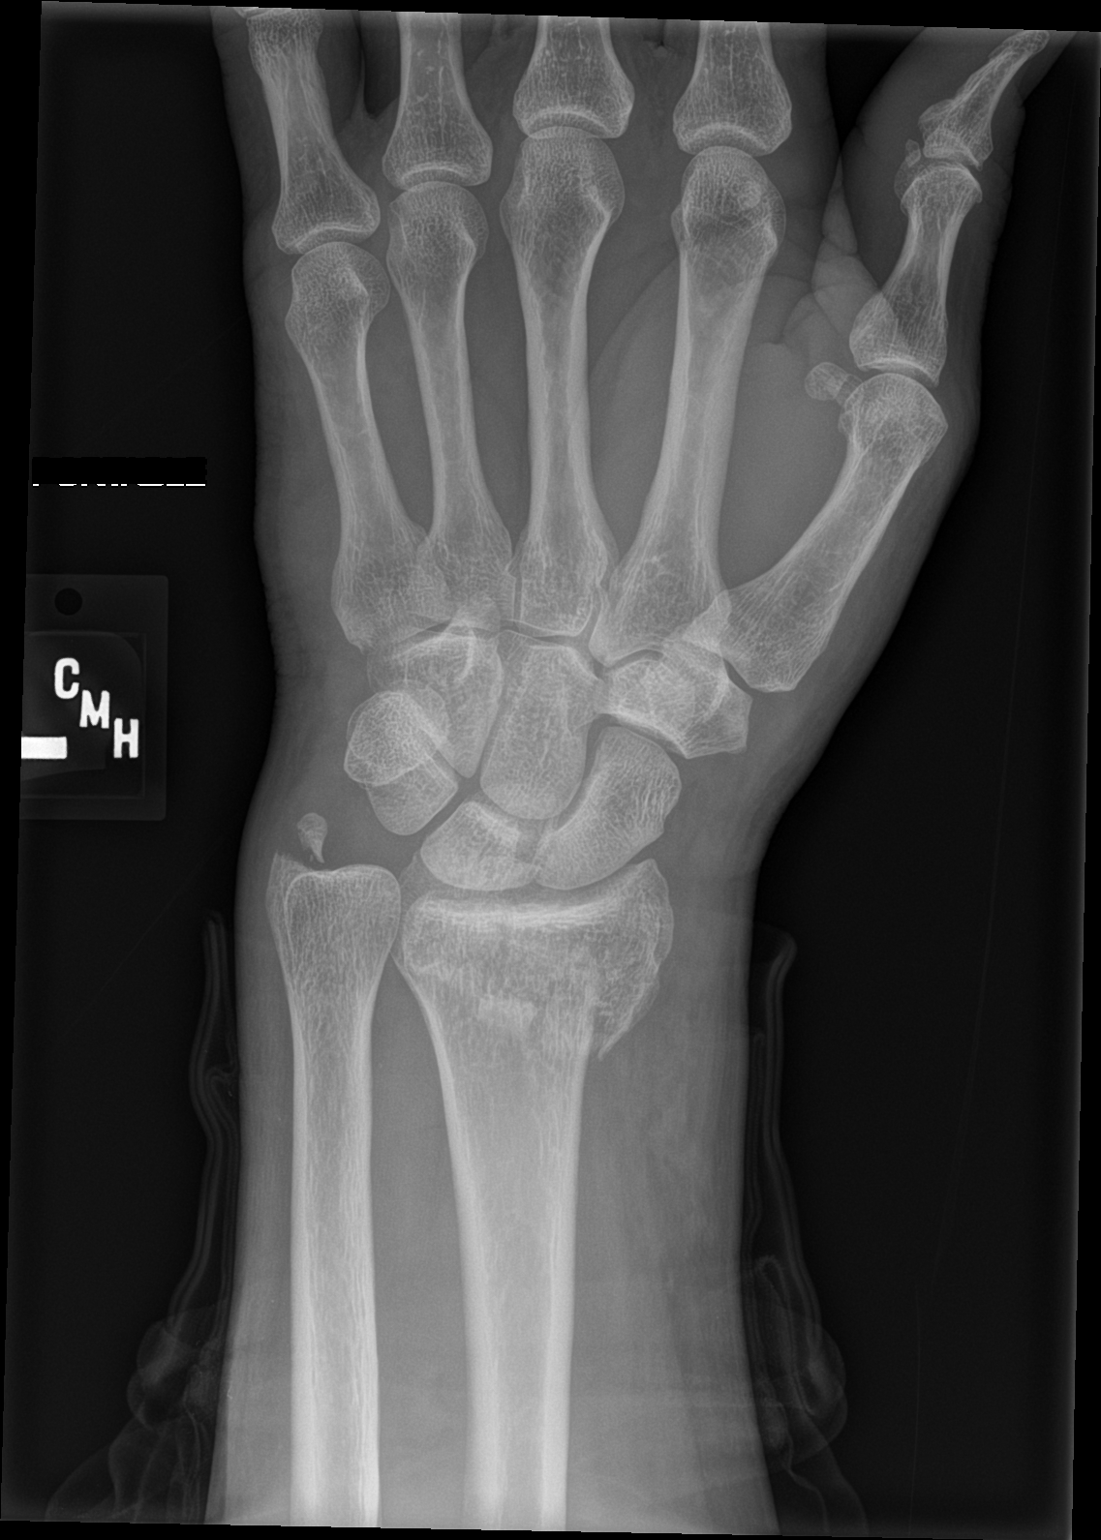

[wrist obl]
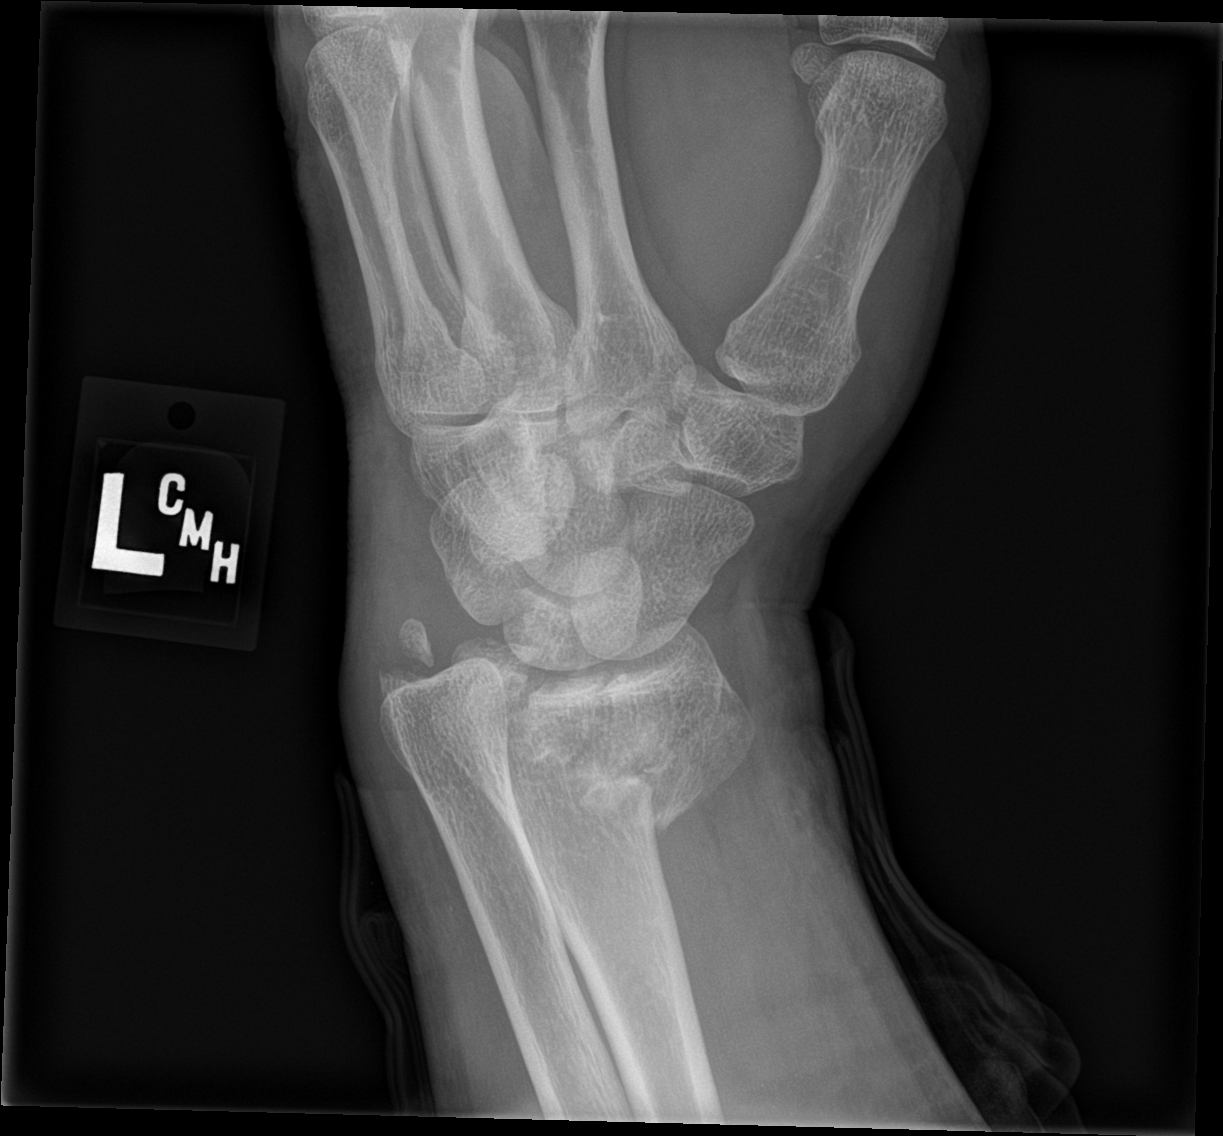

[wrist tunnel]
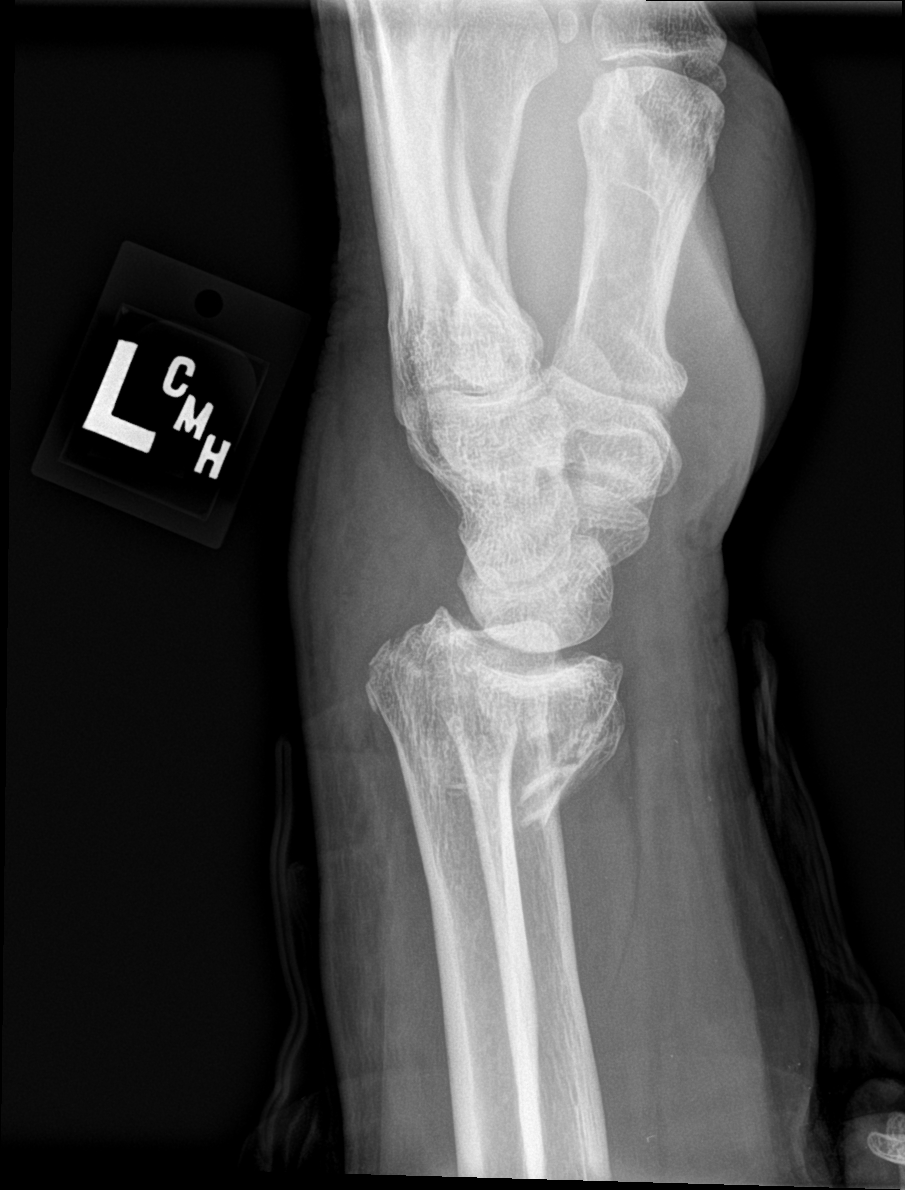

[3 of 3 positions shown; findings below may reference images not displayed]

FINDINGS: There is a comminuted intra-articular fracture of the distal radius
with volar angulation and displacement. Mildly displaced fracture of
the ulnar styloid. No dislocation. There is soft tissue swelling of
the wrist.
IMPRESSION: Comminuted intra-articular fracture of the distal radius with volar
angulation and displacement. Mildly displaced ulnar styloid
fracture.

## 2022-01-23 ENCOUNTER — Ambulatory Visit (INDEPENDENT_AMBULATORY_CARE_PROVIDER_SITE_OTHER): Payer: BC Managed Care – PPO | Admitting: Family Medicine

## 2022-01-23 VITALS — BP 150/98 | HR 65 | Temp 98.5°F | Ht 71.0 in | Wt 220.0 lb

## 2022-01-23 DIAGNOSIS — I10 Essential (primary) hypertension: Secondary | ICD-10-CM

## 2022-01-23 DIAGNOSIS — Z23 Encounter for immunization: Secondary | ICD-10-CM | POA: Diagnosis not present

## 2022-01-23 DIAGNOSIS — Z125 Encounter for screening for malignant neoplasm of prostate: Secondary | ICD-10-CM

## 2022-01-23 DIAGNOSIS — Z0000 Encounter for general adult medical examination without abnormal findings: Secondary | ICD-10-CM | POA: Insufficient documentation

## 2022-01-23 DIAGNOSIS — Z130 Encounter for screening for diseases of the blood and blood-forming organs and certain disorders involving the immune mechanism: Secondary | ICD-10-CM

## 2022-01-23 DIAGNOSIS — E039 Hypothyroidism, unspecified: Secondary | ICD-10-CM

## 2022-01-23 DIAGNOSIS — E782 Mixed hyperlipidemia: Secondary | ICD-10-CM | POA: Diagnosis not present

## 2022-01-23 NOTE — Patient Instructions (Signed)
Labs today.  Follow up in 6 months.  Take care  Dr. Geoffrey Hynes  

## 2022-01-23 NOTE — Progress Notes (Signed)
? ?Subjective:  ?Patient ID: Mason Andersen, male    DOB: 1961-08-09  Age: 61 y.o. MRN: [id number] ? ?CC: ?Chief Complaint  ?Patient presents with  ? Annual Exam  ? ? ?HPI: ? ?62 year old male with HTN, Hypothyroidism, Osteopenia, Hyperlipidemia presents for an annual physical.  Patient states that he is doing well.  He has no complaints or concerns at this time. ? ?Preventative Healthcare ?Colonoscopy: Up-to-date. ?Immunizations ?Tetanus -needs tetanus. ?Pneumococcal - Not indicated at this time. ?Zoster - Declines. ?Prostate cancer screening: Screening PSA today. ?Hepatitis C screening - Completed. ?Labs: Labs today. ?Alcohol use: See below. ?Smoking/tobacco use: No. ?STD/HIV testing: Declines. ? ?Patient Active Problem List  ? Diagnosis Date Noted  ? Annual physical exam 01/23/2022  ? Osteopenia 10/10/2021  ? Mixed hyperlipidemia 10/10/2021  ? Essential hypertension 01/04/2020  ? Erectile dysfunction 11/27/2016  ? Low testosterone 11/16/2013  ? Hypothyroidism 11/16/2013  ? ? ?Social Hx   ?Social History  ? ?Socioeconomic History  ? Marital status: Single  ?  Spouse name: Not on file  ? Number of children: Not on file  ? Years of education: Not on file  ? Highest education level: Not on file  ?Occupational History  ? Not on file  ?Tobacco Use  ? Smoking status: Never  ? Smokeless tobacco: Never  ?Vaping Use  ? Vaping Use: Never used  ?Substance and Sexual Activity  ? Alcohol use: Yes  ?  Alcohol/week: 3.0 standard drinks  ?  Types: 3 Shots of liquor per week  ? Drug use: No  ? Sexual activity: Not on file  ?Other Topics Concern  ? Not on file  ?Social History Narrative  ? Not on file  ? ?Social Determinants of Health  ? ?Financial Resource Strain: Not on file  ?Food Insecurity: Not on file  ?Transportation Needs: Not on file  ?Physical Activity: Not on file  ?Stress: Not on file  ?Social Connections: Not on file  ? ? ?Review of Systems  ?Constitutional: Negative.   ?Respiratory: Negative.    ?Cardiovascular:  Negative.   ? ? ?Objective:  ?BP (!) 150/98   Pulse 65   Temp 98.5 ?F (36.9 ?C)   Ht _0  (1.803 m)   Wt 220 lb (99.8 kg)   SpO2 99%   BMI 30.68 kg/m?  ? ? ?  01/23/2022  ?  8:49 AM 10/10/2021  ?  8:59 AM 09/07/2021  ? 10:56 PM  ?BP/Weight  ?Systolic BP 308 657 846  ?Diastolic BP 98 74 85  ?Wt. (Lbs) 220 222   ?BMI 30.68 kg/m2 30.96 kg/m2   ? ? ?Physical Exam ?Vitals and nursing note reviewed.  ?Constitutional:   ?   General: He is not in acute distress. ?   Appearance: Normal appearance. He is not ill-appearing.  ?HENT:  ?   Head: Normocephalic and atraumatic.  ?   Mouth/Throat:  ?   Pharynx: Oropharynx is clear. No oropharyngeal exudate or posterior oropharyngeal erythema.  ?Eyes:  ?   Conjunctiva/sclera: Conjunctivae normal.  ?   Pupils: Pupils are equal, round, and reactive to light.  ?Cardiovascular:  ?   Rate and Rhythm: Normal rate and regular rhythm.  ?   Heart sounds: No murmur heard. ?Pulmonary:  ?   Effort: Pulmonary effort is normal.  ?   Breath sounds: Normal breath sounds. No wheezing, rhonchi or rales.  ?Abdominal:  ?   General: There is no distension.  ?   Palpations: Abdomen is soft.  ?  Tenderness: There is no abdominal tenderness.  ?Skin: ?   General: Skin is warm.  ?   Findings: No rash.  ?Neurological:  ?   General: No focal deficit present.  ?   Mental Status: He is alert.  ?Psychiatric:     ?   Mood and Affect: Mood normal.     ?   Behavior: Behavior normal.  ? ? ?Lab Results  ?Component Value Date  ? WBC 5.0 12/14/2020  ? HGB 15.1 12/14/2020  ? HCT 45.5 12/14/2020  ? PLT 267 12/14/2020  ? GLUCOSE 98 12/14/2020  ? CHOL 215 (H) 12/14/2020  ? TRIG 237 (H) 12/14/2020  ? HDL 51 12/14/2020  ? LDLCALC 122 (H) 12/14/2020  ? ALT 30 12/14/2020  ? AST 24 12/14/2020  ? NA 139 12/14/2020  ? K 4.8 12/14/2020  ? CL 102 12/14/2020  ? CREATININE 1.09 12/14/2020  ? BUN 17 12/14/2020  ? CO2 23 12/14/2020  ? TSH 2.370 12/14/2020  ? PSA 0.72 11/16/2014  ? ? ? ?Assessment & Plan:  ? ?Problem List Items  Addressed This Visit   ? ?  ? Cardiovascular and Mediastinum  ? Essential hypertension  ? Relevant Orders  ? CMP14+EGFR  ?  ? Endocrine  ? Hypothyroidism  ? Relevant Orders  ? TSH  ?  ? Other  ? Mixed hyperlipidemia  ? Relevant Orders  ? Lipid panel  ? Annual physical exam - Primary  ?  Patient is doing well. ?Tdap given today. ?Labs today. ?Preventative health care updated. ?  ?  ? ?Other Visit Diagnoses   ? ? Screening for deficiency anemia      ? Relevant Orders  ? CBC  ? Prostate cancer screening      ? Relevant Orders  ? PSA  ? Immunization due      ? Relevant Orders  ? Tdap vaccine greater than or equal to 7yo IM (Completed)  ? ?  ? ?Thersa Salt DO ?[place] ? ?

## 2022-01-23 NOTE — Assessment & Plan Note (Signed)
Patient is doing well. ?Tdap given today. ?Labs today. ?Preventative health care updated. ?

## 2022-01-24 LAB — CMP14+EGFR
ALT: 25 IU/L (ref 0–44)
AST: 17 IU/L (ref 0–40)
Albumin/Globulin Ratio: 1.9 (ref 1.2–2.2)
Albumin: 4.3 g/dL (ref 3.8–4.9)
Alkaline Phosphatase: 70 IU/L (ref 44–121)
BUN/Creatinine Ratio: 16 (ref 9–20)
BUN: 17 mg/dL (ref 6–24)
Bilirubin Total: 0.5 mg/dL (ref 0.0–1.2)
CO2: 26 mmol/L (ref 20–29)
Calcium: 9.3 mg/dL (ref 8.7–10.2)
Chloride: 103 mmol/L (ref 96–106)
Creatinine, Ser: 1.08 mg/dL (ref 0.76–1.27)
Globulin, Total: 2.3 g/dL (ref 1.5–4.5)
Glucose: 107 mg/dL — ABNORMAL HIGH (ref 70–99)
Potassium: 4.5 mmol/L (ref 3.5–5.2)
Sodium: 139 mmol/L (ref 134–144)
Total Protein: 6.6 g/dL (ref 6.0–8.5)
eGFR: 80 mL/min/{1.73_m2} (ref >59)

## 2022-01-24 LAB — LIPID PANEL
Chol/HDL Ratio: 4 ratio (ref 0.0–5.0)
Cholesterol, Total: 206 mg/dL — ABNORMAL HIGH (ref 100–199)
HDL: 51 mg/dL (ref >39)
LDL Chol Calc (NIH): 120 mg/dL — ABNORMAL HIGH (ref 0–99)
Triglycerides: 201 mg/dL — ABNORMAL HIGH (ref 0–149)
VLDL Cholesterol Cal: 35 mg/dL (ref 5–40)

## 2022-01-24 LAB — CBC
Hematocrit: 45.9 % (ref 37.5–51.0)
Hemoglobin: 15.3 g/dL (ref 13.0–17.7)
MCH: 31.3 pg (ref 26.6–33.0)
MCHC: 33.3 g/dL (ref 31.5–35.7)
MCV: 94 fL (ref 79–97)
Platelets: 283 10*3/uL (ref 150–450)
RBC: 4.89 x10E6/uL (ref 4.14–5.80)
RDW: 12.6 % (ref 11.6–15.4)
WBC: 5.5 10*3/uL (ref 3.4–10.8)

## 2022-01-24 LAB — PSA: Prostate Specific Ag, Serum: 0.9 ng/mL (ref 0.0–4.0)

## 2022-01-24 LAB — TSH: TSH: 3.27 u[IU]/mL (ref 0.450–4.500)

## 2022-01-26 ENCOUNTER — Other Ambulatory Visit: Payer: Self-pay | Admitting: Family Medicine

## 2022-01-26 MED ORDER — ROSUVASTATIN CALCIUM 10 MG PO TABS: 10.0000 mg | ORAL_TABLET | Freq: Every day | ORAL | 3 refills | Status: DC

## 2022-02-21 DIAGNOSIS — S52572D Other intraarticular fracture of lower end of left radius, subsequent encounter for closed fracture with routine healing: Secondary | ICD-10-CM | POA: Diagnosis not present

## 2022-02-21 DIAGNOSIS — R200 Anesthesia of skin: Secondary | ICD-10-CM | POA: Diagnosis not present

## 2022-04-04 DIAGNOSIS — S52572D Other intraarticular fracture of lower end of left radius, subsequent encounter for closed fracture with routine healing: Secondary | ICD-10-CM | POA: Diagnosis not present

## 2022-04-04 DIAGNOSIS — R200 Anesthesia of skin: Secondary | ICD-10-CM | POA: Diagnosis not present

## 2022-07-25 ENCOUNTER — Encounter: Payer: Self-pay | Admitting: Family Medicine

## 2022-07-25 ENCOUNTER — Ambulatory Visit (INDEPENDENT_AMBULATORY_CARE_PROVIDER_SITE_OTHER): Payer: BC Managed Care – PPO | Admitting: Family Medicine

## 2022-07-25 DIAGNOSIS — I10 Essential (primary) hypertension: Secondary | ICD-10-CM | POA: Diagnosis not present

## 2022-07-25 DIAGNOSIS — E039 Hypothyroidism, unspecified: Secondary | ICD-10-CM

## 2022-07-25 DIAGNOSIS — E782 Mixed hyperlipidemia: Secondary | ICD-10-CM

## 2022-07-25 MED ORDER — KETOCONAZOLE 2 % EX CREA: 1.0000 | TOPICAL_CREAM | Freq: Two times a day (BID) | CUTANEOUS | 0 refills | Status: AC | PRN

## 2022-07-25 NOTE — Patient Instructions (Signed)
Continue your medications. Follow up annually.  Take care  Dr. Viet Kemmerer  

## 2022-07-25 NOTE — Assessment & Plan Note (Signed)
Tolerating statin. Continue. 

## 2022-07-25 NOTE — Assessment & Plan Note (Signed)
Well controlled. Continue amlodipine 

## 2022-07-25 NOTE — Assessment & Plan Note (Signed)
Stable.  Continue current dosing of Synthroid. 

## 2022-07-25 NOTE — Progress Notes (Signed)
Subjective:  Patient ID: Mason Andersen, male    DOB: Mar 07, 1961  Age: 61 y.o. MRN: [id number]  CC: Chief Complaint  Patient presents with   Hypertension    Pt arrives for follow up on blood pressure. Has not been checking at home; no issues at this time. Would like refill on Ketoconazole Cream 2%.     HPI:  61 year old male with hypertension, hypothyroidism, osteopenia, hyperlipidemia presents for follow-up.  Patient's hypertension is well controlled on amlodipine.  He is doing well at this time.  Hypothyroidism has been stable on Synthroid 100 mcg daily.  Patient is tolerating statin well.  No side effects.  Patient reports that he needs a refill on ketoconazole.  He has a history of fungal rash and would like a refill on this medication.  Patient Active Problem List   Diagnosis Date Noted   Annual physical exam 01/23/2022   Osteopenia 10/10/2021   Mixed hyperlipidemia 10/10/2021   Essential hypertension 01/04/2020   Hypothyroidism 11/16/2013    Social Hx   Social History   Socioeconomic History   Marital status: Single    Spouse name: Not on file   Number of children: Not on file   Years of education: Not on file   Highest education level: Not on file  Occupational History   Not on file  Tobacco Use   Smoking status: Never   Smokeless tobacco: Never  Vaping Use   Vaping Use: Never used  Substance and Sexual Activity   Alcohol use: Yes    Alcohol/week: 3.0 standard drinks of alcohol    Types: 3 Shots of liquor per week   Drug use: No   Sexual activity: Not on file  Other Topics Concern   Not on file  Social History Narrative   Not on file   Social Determinants of Health   Financial Resource Strain: Not on file  Food Insecurity: Not on file  Transportation Needs: Not on file  Physical Activity: Not on file  Stress: Not on file  Social Connections: Not on file    Review of Systems  Constitutional: Negative.   Respiratory: Negative.     Cardiovascular: Negative.    Objective:  BP 120/70   Pulse (!) 59   Temp 97.8 F (36.6 C)   Wt 215 lb (97.5 kg)   SpO2 97%   BMI 29.99 kg/m      07/25/2022    9:14 AM 01/23/2022    8:49 AM 10/10/2021    8:59 AM  BP/Weight  Systolic BP 354 656 812  Diastolic BP 70 98 74  Wt. (Lbs) 215 220 222  BMI 29.99 kg/m2 30.68 kg/m2 30.96 kg/m2    Physical Exam Vitals and nursing note reviewed.  Constitutional:      General: He is not in acute distress.    Appearance: Normal appearance.  HENT:     Head: Normocephalic and atraumatic.  Eyes:     General:        Right eye: No discharge.        Left eye: No discharge.     Conjunctiva/sclera: Conjunctivae normal.  Cardiovascular:     Rate and Rhythm: Normal rate and regular rhythm.  Pulmonary:     Effort: Pulmonary effort is normal.     Breath sounds: Normal breath sounds. No wheezing, rhonchi or rales.  Neurological:     Mental Status: He is alert.  Psychiatric:        Mood and Affect: Mood normal.  Behavior: Behavior normal.     Lab Results  Component Value Date   WBC 5.5 01/23/2022   HGB 15.3 01/23/2022   HCT 45.9 01/23/2022   PLT 283 01/23/2022   GLUCOSE 107 (H) 01/23/2022   CHOL 206 (H) 01/23/2022   TRIG 201 (H) 01/23/2022   HDL 51 01/23/2022   LDLCALC 120 (H) 01/23/2022   ALT 25 01/23/2022   AST 17 01/23/2022   NA 139 01/23/2022   K 4.5 01/23/2022   CL 103 01/23/2022   CREATININE 1.08 01/23/2022   BUN 17 01/23/2022   CO2 26 01/23/2022   TSH 3.270 01/23/2022   PSA 0.72 11/16/2014     Assessment & Plan:   Problem List Items Addressed This Visit       Cardiovascular and Mediastinum   Essential hypertension    Well-controlled.  Continue amlodipine.        Endocrine   Hypothyroidism    Stable.  Continue current dosing of Synthroid.        Other   Mixed hyperlipidemia    Tolerating statin.  Continue.       Meds ordered this encounter  Medications   ketoconazole (NIZORAL) 2 % cream     Sig: Apply 1 Application topically 2 (two) times daily as needed for irritation.    Dispense:  60 g    Refill:  0    Follow-up:  Return in about 1 year (around 07/26/2023).  Everlene Other DO [facility]

## 2022-12-09 ENCOUNTER — Other Ambulatory Visit: Payer: Self-pay | Admitting: Family Medicine

## 2022-12-18 ENCOUNTER — Other Ambulatory Visit: Payer: Self-pay | Admitting: Family Medicine

## 2023-01-21 ENCOUNTER — Other Ambulatory Visit: Payer: Self-pay | Admitting: Family Medicine

## 2023-04-22 ENCOUNTER — Other Ambulatory Visit: Payer: Self-pay | Admitting: Family Medicine

## 2023-06-06 ENCOUNTER — Other Ambulatory Visit: Payer: Self-pay | Admitting: Family Medicine

## 2023-06-10 ENCOUNTER — Other Ambulatory Visit: Payer: Self-pay | Admitting: Family Medicine

## 2023-07-23 ENCOUNTER — Other Ambulatory Visit: Payer: Self-pay | Admitting: Family Medicine

## 2023-08-24 ENCOUNTER — Other Ambulatory Visit: Payer: Self-pay | Admitting: Family Medicine

## 2023-09-03 ENCOUNTER — Other Ambulatory Visit: Payer: Self-pay | Admitting: Family Medicine

## 2023-09-07 ENCOUNTER — Other Ambulatory Visit: Payer: Self-pay | Admitting: Family Medicine

## 2023-09-07 ENCOUNTER — Telehealth: Payer: Self-pay

## 2023-09-07 NOTE — Telephone Encounter (Signed)
PT scheduled Physical for 09/14/2023 and would like blood work for this appt. Please advise

## 2023-09-10 ENCOUNTER — Other Ambulatory Visit: Payer: Self-pay

## 2023-09-10 DIAGNOSIS — E039 Hypothyroidism, unspecified: Secondary | ICD-10-CM

## 2023-09-10 DIAGNOSIS — E782 Mixed hyperlipidemia: Secondary | ICD-10-CM

## 2023-09-10 DIAGNOSIS — Z130 Encounter for screening for diseases of the blood and blood-forming organs and certain disorders involving the immune mechanism: Secondary | ICD-10-CM

## 2023-09-10 DIAGNOSIS — Z125 Encounter for screening for malignant neoplasm of prostate: Secondary | ICD-10-CM

## 2023-09-10 DIAGNOSIS — Z0000 Encounter for general adult medical examination without abnormal findings: Secondary | ICD-10-CM

## 2023-09-10 DIAGNOSIS — I10 Essential (primary) hypertension: Secondary | ICD-10-CM

## 2023-09-10 NOTE — Telephone Encounter (Signed)
Called pt and informed him of labs being ordered

## 2023-09-11 DIAGNOSIS — Z0000 Encounter for general adult medical examination without abnormal findings: Secondary | ICD-10-CM | POA: Diagnosis not present

## 2023-09-11 DIAGNOSIS — Z125 Encounter for screening for malignant neoplasm of prostate: Secondary | ICD-10-CM | POA: Diagnosis not present

## 2023-09-11 DIAGNOSIS — I10 Essential (primary) hypertension: Secondary | ICD-10-CM | POA: Diagnosis not present

## 2023-09-11 DIAGNOSIS — E782 Mixed hyperlipidemia: Secondary | ICD-10-CM | POA: Diagnosis not present

## 2023-09-11 DIAGNOSIS — Z130 Encounter for screening for diseases of the blood and blood-forming organs and certain disorders involving the immune mechanism: Secondary | ICD-10-CM | POA: Diagnosis not present

## 2023-09-11 DIAGNOSIS — E039 Hypothyroidism, unspecified: Secondary | ICD-10-CM | POA: Diagnosis not present

## 2023-09-12 LAB — CBC WITH DIFFERENTIAL/PLATELET
Basophils Absolute: 0.1 10*3/uL (ref 0.0–0.2)
Basos: 1 %
EOS (ABSOLUTE): 0.2 10*3/uL (ref 0.0–0.4)
Eos: 3 %
Hematocrit: 46.1 % (ref 37.5–51.0)
Hemoglobin: 15.1 g/dL (ref 13.0–17.7)
Immature Grans (Abs): 0 10*3/uL (ref 0.0–0.1)
Immature Granulocytes: 0 %
Lymphocytes Absolute: 1.9 10*3/uL (ref 0.7–3.1)
Lymphs: 31 %
MCH: 31.1 pg (ref 26.6–33.0)
MCHC: 32.8 g/dL (ref 31.5–35.7)
MCV: 95 fL (ref 79–97)
Monocytes Absolute: 0.4 10*3/uL (ref 0.1–0.9)
Monocytes: 7 %
Neutrophils Absolute: 3.6 10*3/uL (ref 1.4–7.0)
Neutrophils: 58 %
Platelets: 257 10*3/uL (ref 150–450)
RBC: 4.86 x10E6/uL (ref 4.14–5.80)
RDW: 12.7 % (ref 11.6–15.4)
WBC: 6.3 10*3/uL (ref 3.4–10.8)

## 2023-09-12 LAB — COMPREHENSIVE METABOLIC PANEL
ALT: 27 [IU]/L (ref 0–44)
AST: 20 [IU]/L (ref 0–40)
Albumin: 4.4 g/dL (ref 3.8–4.9)
Alkaline Phosphatase: 77 [IU]/L (ref 44–121)
BUN/Creatinine Ratio: 17 (ref 10–24)
BUN: 16 mg/dL (ref 8–27)
Bilirubin Total: 0.4 mg/dL (ref 0.0–1.2)
CO2: 25 mmol/L (ref 20–29)
Calcium: 9.1 mg/dL (ref 8.6–10.2)
Chloride: 103 mmol/L (ref 96–106)
Creatinine, Ser: 0.92 mg/dL (ref 0.76–1.27)
Globulin, Total: 2.5 g/dL (ref 1.5–4.5)
Glucose: 99 mg/dL (ref 70–99)
Potassium: 4.8 mmol/L (ref 3.5–5.2)
Sodium: 140 mmol/L (ref 134–144)
Total Protein: 6.9 g/dL (ref 6.0–8.5)
eGFR: 95 mL/min/{1.73_m2} (ref >59)

## 2023-09-12 LAB — LIPID PANEL
Chol/HDL Ratio: 3.2 ratio (ref 0.0–5.0)
Cholesterol, Total: 169 mg/dL (ref 100–199)
HDL: 53 mg/dL (ref >39)
LDL Chol Calc (NIH): 84 mg/dL (ref 0–99)
Triglycerides: 190 mg/dL — ABNORMAL HIGH (ref 0–149)
VLDL Cholesterol Cal: 32 mg/dL (ref 5–40)

## 2023-09-12 LAB — HEMOGLOBIN A1C
Est. average glucose Bld gHb Est-mCnc: 126 mg/dL
Hgb A1c MFr Bld: 6 % — ABNORMAL HIGH (ref 4.8–5.6)

## 2023-09-12 LAB — PSA: Prostate Specific Ag, Serum: 1.6 ng/mL (ref 0.0–4.0)

## 2023-09-12 LAB — TSH: TSH: 4.61 u[IU]/mL — ABNORMAL HIGH (ref 0.450–4.500)

## 2023-09-14 ENCOUNTER — Ambulatory Visit (INDEPENDENT_AMBULATORY_CARE_PROVIDER_SITE_OTHER): Payer: BC Managed Care – PPO | Admitting: Family Medicine

## 2023-09-14 ENCOUNTER — Other Ambulatory Visit: Payer: Self-pay | Admitting: Family Medicine

## 2023-09-14 ENCOUNTER — Encounter: Payer: Self-pay | Admitting: Family Medicine

## 2023-09-14 VITALS — BP 136/84 | HR 68 | Temp 98.1°F | Ht 71.0 in | Wt 220.0 lb

## 2023-09-14 DIAGNOSIS — E039 Hypothyroidism, unspecified: Secondary | ICD-10-CM | POA: Diagnosis not present

## 2023-09-14 DIAGNOSIS — Z0001 Encounter for general adult medical examination with abnormal findings: Secondary | ICD-10-CM | POA: Diagnosis not present

## 2023-09-14 DIAGNOSIS — Z0000 Encounter for general adult medical examination without abnormal findings: Secondary | ICD-10-CM

## 2023-09-14 MED ORDER — LEVOTHYROXINE SODIUM 112 MCG PO TABS
112.0000 ug | ORAL_TABLET | Freq: Every day | ORAL | 1 refills | Status: DC
Start: 2023-09-14 — End: 2024-03-14

## 2023-09-14 NOTE — Progress Notes (Signed)
Subjective:  Patient ID: Mason Andersen, male    DOB: 24-Nov-1960  Age: 62 y.o. MRN: [id number]  CC:   Chief Complaint  Patient presents with   Annual Exam    HPI:  62 year old male presents for an annual exam.  Patient states that he is doing well.  He has no complaints or concerns at this time.  Denies chest pain, shortness of breath.  Patient declines his flu vaccine as well as shingles vaccine.  Colonoscopy up-to-date.  Eye exam and dental exam up-to-date.  Has had recent labs.  Will review with him today.  Patient Active Problem List   Diagnosis Date Noted   Annual physical exam 01/23/2022   Osteopenia 10/10/2021   Mixed hyperlipidemia 10/10/2021   Essential hypertension 01/04/2020   Hypothyroidism 11/16/2013    Social Hx   Social History   Socioeconomic History   Marital status: Single    Spouse name: Not on file   Number of children: Not on file   Years of education: Not on file   Highest education level: Not on file  Occupational History   Not on file  Tobacco Use   Smoking status: Never   Smokeless tobacco: Never  Vaping Use   Vaping status: Never Used  Substance and Sexual Activity   Alcohol use: Yes    Alcohol/week: 3.0 standard drinks of alcohol    Types: 3 Shots of liquor per week   Drug use: No   Sexual activity: Not on file  Other Topics Concern   Not on file  Social History Narrative   Not on file   Social Determinants of Health   Financial Resource Strain: Not on file  Food Insecurity: Not on file  Transportation Needs: Not on file  Physical Activity: Not on file  Stress: Not on file  Social Connections: Not on file    Review of Systems Per HPI  Objective:  BP 136/84   Pulse 68   Temp 98.1 F (36.7 C)   Ht 5\' 11"  (1.803 m)   Wt 220 lb (99.8 kg)   SpO2 96%   BMI 30.68 kg/m      09/14/2023    9:30 AM 07/25/2022    9:14 AM 01/23/2022    8:49 AM  BP/Weight  Systolic BP 136 120 150  Diastolic BP 84 70 98  Wt. (Lbs) 220 215  220  BMI 30.68 kg/m2 29.99 kg/m2 30.68 kg/m2    Physical Exam Vitals and nursing note reviewed.  Constitutional:      General: He is not in acute distress.    Appearance: Normal appearance.  HENT:     Head: Normocephalic and atraumatic.     Nose: Nose normal.     Mouth/Throat:     Pharynx: Oropharynx is clear.  Eyes:     General:        Right eye: No discharge.        Left eye: No discharge.     Conjunctiva/sclera: Conjunctivae normal.  Cardiovascular:     Rate and Rhythm: Normal rate and regular rhythm.  Pulmonary:     Effort: Pulmonary effort is normal.     Breath sounds: Normal breath sounds. No wheezing, rhonchi or rales.  Abdominal:     General: There is no distension.     Palpations: Abdomen is soft.     Tenderness: There is no abdominal tenderness.  Musculoskeletal:     Cervical back: Neck supple.  Lymphadenopathy:     Cervical: No  cervical adenopathy.  Skin:    General: Skin is warm.     Findings: No rash.  Neurological:     General: No focal deficit present.     Mental Status: He is alert.  Psychiatric:        Mood and Affect: Mood normal.        Behavior: Behavior normal.     Lab Results  Component Value Date   WBC 6.3 09/11/2023   HGB 15.1 09/11/2023   HCT 46.1 09/11/2023   PLT 257 09/11/2023   GLUCOSE 99 09/11/2023   CHOL 169 09/11/2023   TRIG 190 (H) 09/11/2023   HDL 53 09/11/2023   LDLCALC 84 09/11/2023   ALT 27 09/11/2023   AST 20 09/11/2023   NA 140 09/11/2023   K 4.8 09/11/2023   CL 103 09/11/2023   CREATININE 0.92 09/11/2023   BUN 16 09/11/2023   CO2 25 09/11/2023   TSH 4.610 (H) 09/11/2023   PSA 0.72 11/16/2014   HGBA1C 6.0 (H) 09/11/2023     Assessment & Plan:   Problem List Items Addressed This Visit       Endocrine   Hypothyroidism   Relevant Medications   levothyroxine (SYNTHROID) 112 MCG tablet   Other Relevant Orders   TSH + free T4     Other   Annual physical exam - Primary    Overall doing well.  Discussed  preventative health care today.  He declines flu vaccine.  Declines shingles vaccine.  The remainder of his preventative health care is up-to-date.  I reviewed his labs with him today.  TSH was mildly elevated.  Increasing levothyroxine to 112 mcg.  Follow-up labs in 6 weeks.  He is to follow-up with me in 6 months.       Meds ordered this encounter  Medications   levothyroxine (SYNTHROID) 112 MCG tablet    Sig: Take 1 tablet (112 mcg total) by mouth daily.    Dispense:  90 tablet    Refill:  1    Follow-up:  Return in about 6 months (around 03/13/2024).  Everlene Other DO [facility]

## 2023-09-14 NOTE — Assessment & Plan Note (Signed)
Overall doing well.  Discussed preventative health care today.  He declines flu vaccine.  Declines shingles vaccine.  The remainder of his preventative health care is up-to-date.  I reviewed his labs with him today.  TSH was mildly elevated.  Increasing levothyroxine to 112 mcg.  Follow-up labs in 6 weeks.  He is to follow-up with me in 6 months.

## 2023-09-14 NOTE — Patient Instructions (Addendum)
Thyroid recheck in 6 weeks. Order is in.  Follow up in 6 months.  Take care  Dr. Adriana Simas

## 2023-10-01 ENCOUNTER — Other Ambulatory Visit: Payer: Self-pay | Admitting: Family Medicine

## 2023-10-01 MED ORDER — ROSUVASTATIN CALCIUM 10 MG PO TABS: 10.0000 mg | ORAL_TABLET | Freq: Every day | ORAL | 3 refills | Status: DC

## 2023-10-06 ENCOUNTER — Other Ambulatory Visit: Payer: Self-pay | Admitting: Family Medicine

## 2023-10-22 DIAGNOSIS — E039 Hypothyroidism, unspecified: Secondary | ICD-10-CM | POA: Diagnosis not present

## 2023-10-23 LAB — TSH+FREE T4
Free T4: 1.34 ng/dL (ref 0.82–1.77)
TSH: 3.21 u[IU]/mL (ref 0.450–4.500)

## 2023-12-22 ENCOUNTER — Other Ambulatory Visit: Payer: Self-pay | Admitting: Family Medicine

## 2024-02-11 ENCOUNTER — Other Ambulatory Visit: Payer: Self-pay | Admitting: Family Medicine

## 2024-02-12 ENCOUNTER — Other Ambulatory Visit: Payer: Self-pay

## 2024-02-12 MED ORDER — ROSUVASTATIN CALCIUM 10 MG PO TABS: 10.0000 mg | ORAL_TABLET | Freq: Every day | ORAL | 3 refills | Status: AC

## 2024-03-14 ENCOUNTER — Ambulatory Visit (INDEPENDENT_AMBULATORY_CARE_PROVIDER_SITE_OTHER): Payer: BC Managed Care – PPO | Admitting: Family Medicine

## 2024-03-14 ENCOUNTER — Encounter: Payer: Self-pay | Admitting: Family Medicine

## 2024-03-14 VITALS — BP 129/82 | HR 54 | Temp 97.3°F | Ht 71.0 in | Wt 224.0 lb

## 2024-03-14 DIAGNOSIS — I10 Essential (primary) hypertension: Secondary | ICD-10-CM | POA: Diagnosis not present

## 2024-03-14 DIAGNOSIS — R7303 Prediabetes: Secondary | ICD-10-CM

## 2024-03-14 DIAGNOSIS — L989 Disorder of the skin and subcutaneous tissue, unspecified: Secondary | ICD-10-CM | POA: Insufficient documentation

## 2024-03-14 DIAGNOSIS — E039 Hypothyroidism, unspecified: Secondary | ICD-10-CM

## 2024-03-14 DIAGNOSIS — E782 Mixed hyperlipidemia: Secondary | ICD-10-CM | POA: Diagnosis not present

## 2024-03-14 MED ORDER — LEVOTHYROXINE SODIUM 112 MCG PO TABS
112.0000 ug | ORAL_TABLET | Freq: Every day | ORAL | 1 refills | Status: DC
Start: 2024-03-14 — End: 2024-09-08

## 2024-03-14 NOTE — Assessment & Plan Note (Signed)
 TSH ordered.  Continue levothyroxine .  Refilled today.

## 2024-03-14 NOTE — Assessment & Plan Note (Signed)
Stable. °-Continue amlodipine °

## 2024-03-14 NOTE — Assessment & Plan Note (Signed)
Lipid panel to assess.  Continue Crestor.

## 2024-03-14 NOTE — Patient Instructions (Signed)
Continue your medications.  Labs today.  Follow up in 6 months.

## 2024-03-14 NOTE — Assessment & Plan Note (Signed)
 Advise close monitoring.  No need for dermatology referral at this time.

## 2024-03-14 NOTE — Progress Notes (Signed)
 Subjective:  Patient ID: Mason Andersen, male    DOB: 1961/09/14  Age: 63 y.o. MRN: [id number]  CC:   Chief Complaint  Patient presents with   Follow-up    6 month f/u hypertension , hypothyroidism     HPI:  63 year old male presents for follow-up.  HTN stable on Norvasc .   Hypothyroidism stable on current dosing.   HLD has been well controlled/stable as well (on Crestor ).  Needs labs today.  Reports that he has several moles.  He has a few on his back.  He is inquiring to whether he needs a referral to our dermatologist.  Patient Active Problem List   Diagnosis Date Noted   Benign skin lesion 03/14/2024   Osteopenia 10/10/2021   Mixed hyperlipidemia 10/10/2021   Essential hypertension 01/04/2020   Hypothyroidism 11/16/2013    Social Hx   Social History   Socioeconomic History   Marital status: Single    Spouse name: Not on file   Number of children: Not on file   Years of education: Not on file   Highest education level: Not on file  Occupational History   Not on file  Tobacco Use   Smoking status: Never   Smokeless tobacco: Never  Vaping Use   Vaping status: Never Used  Substance and Sexual Activity   Alcohol use: Yes    Alcohol/week: 3.0 standard drinks of alcohol    Types: 3 Shots of liquor per week   Drug use: No   Sexual activity: Not on file  Other Topics Concern   Not on file  Social History Narrative   Not on file   Social Drivers of Health   Financial Resource Strain: Not on file  Food Insecurity: Not on file  Transportation Needs: Not on file  Physical Activity: Not on file  Stress: Not on file  Social Connections: Not on file    Review of Systems Per HPI  Objective:  BP 129/82   Pulse (!) 54   Temp (!) 97.3 F (36.3 C)   Ht 5\' 11"  (1.803 m)   Wt 224 lb (101.6 kg)   SpO2 98%   BMI 31.24 kg/m      03/14/2024    9:07 AM 03/14/2024    8:51 AM 09/14/2023    9:30 AM  BP/Weight  Systolic BP 129 150 136  Diastolic BP 82 98  84  Wt. (Lbs)  224 220  BMI  31.24 kg/m2 30.68 kg/m2    Physical Exam Vitals and nursing note reviewed.  Constitutional:      General: He is not in acute distress.    Appearance: Normal appearance.  HENT:     Head: Normocephalic and atraumatic.  Eyes:     General:        Right eye: No discharge.        Left eye: No discharge.     Conjunctiva/sclera: Conjunctivae normal.  Cardiovascular:     Rate and Rhythm: Normal rate and regular rhythm.  Pulmonary:     Effort: Pulmonary effort is normal.     Breath sounds: Normal breath sounds. No wheezing, rhonchi or rales.  Skin:    Comments: Chest, abdomen, and back with multiple benign nevi and cherry hemangiomas.  Neurological:     Mental Status: He is alert.  Psychiatric:        Mood and Affect: Mood normal.        Behavior: Behavior normal.     Lab Results  Component Value Date   WBC 6.3 09/11/2023   HGB 15.1 09/11/2023   HCT 46.1 09/11/2023   PLT 257 09/11/2023   GLUCOSE 99 09/11/2023   CHOL 169 09/11/2023   TRIG 190 (H) 09/11/2023   HDL 53 09/11/2023   LDLCALC 84 09/11/2023   ALT 27 09/11/2023   AST 20 09/11/2023   NA 140 09/11/2023   K 4.8 09/11/2023   CL 103 09/11/2023   CREATININE 0.92 09/11/2023   BUN 16 09/11/2023   CO2 25 09/11/2023   TSH 3.210 10/22/2023   PSA 0.72 11/16/2014   HGBA1C 6.0 (H) 09/11/2023     Assessment & Plan:  Essential hypertension Assessment & Plan: Stable.  Continue amlodipine .  Orders: -     CMP14+EGFR  Hypothyroidism, unspecified type Assessment & Plan: TSH ordered.  Continue levothyroxine .  Refilled today.  Orders: -     TSH  Mixed hyperlipidemia Assessment & Plan: Lipid panel to assess.  Continue Crestor .  Orders: -     Lipid panel  Prediabetes -     Hemoglobin A1c  Benign skin lesion Assessment & Plan: Advise close monitoring.  No need for dermatology referral at this time.   Other orders -     Levothyroxine  Sodium; Take 1 tablet (112 mcg total) by mouth  daily.  Dispense: 90 tablet; Refill: 1    Follow-up:  6 months  Sparrow Sanzo Debrah Fan DO [facility]

## 2024-03-15 LAB — CMP14+EGFR
ALT: 35 IU/L (ref 0–44)
AST: 23 IU/L (ref 0–40)
Albumin: 4.2 g/dL (ref 3.8–4.9)
Alkaline Phosphatase: 80 IU/L (ref 44–121)
BUN/Creatinine Ratio: 17 (ref 10–24)
BUN: 17 mg/dL (ref 8–27)
Bilirubin Total: 0.4 mg/dL (ref 0.0–1.2)
CO2: 23 mmol/L (ref 20–29)
Calcium: 9.6 mg/dL (ref 8.6–10.2)
Chloride: 103 mmol/L (ref 96–106)
Creatinine, Ser: 1 mg/dL (ref 0.76–1.27)
Globulin, Total: 2.1 g/dL (ref 1.5–4.5)
Glucose: 89 mg/dL (ref 70–99)
Potassium: 4.6 mmol/L (ref 3.5–5.2)
Sodium: 139 mmol/L (ref 134–144)
Total Protein: 6.3 g/dL (ref 6.0–8.5)
eGFR: 86 mL/min/{1.73_m2} (ref >59)

## 2024-03-15 LAB — LIPID PANEL
Chol/HDL Ratio: 3 ratio (ref 0.0–5.0)
Cholesterol, Total: 137 mg/dL (ref 100–199)
HDL: 45 mg/dL (ref >39)
LDL Chol Calc (NIH): 67 mg/dL (ref 0–99)
Triglycerides: 145 mg/dL (ref 0–149)
VLDL Cholesterol Cal: 25 mg/dL (ref 5–40)

## 2024-03-15 LAB — TSH: TSH: 1.61 u[IU]/mL (ref 0.450–4.500)

## 2024-03-15 LAB — HEMOGLOBIN A1C
Est. average glucose Bld gHb Est-mCnc: 123 mg/dL
Hgb A1c MFr Bld: 5.9 % — ABNORMAL HIGH (ref 4.8–5.6)

## 2024-03-17 ENCOUNTER — Ambulatory Visit: Payer: Self-pay | Admitting: Family Medicine

## 2024-06-20 ENCOUNTER — Other Ambulatory Visit: Payer: Self-pay

## 2024-06-20 ENCOUNTER — Other Ambulatory Visit: Payer: Self-pay | Admitting: Family Medicine

## 2024-06-20 MED ORDER — AMLODIPINE BESYLATE 5 MG PO TABS: 5.0000 mg | ORAL_TABLET | Freq: Every day | ORAL | 1 refills | Status: AC

## 2024-09-07 ENCOUNTER — Other Ambulatory Visit: Payer: Self-pay | Admitting: Family Medicine

## 2024-09-08 ENCOUNTER — Telehealth: Payer: Self-pay

## 2024-09-08 NOTE — Telephone Encounter (Signed)
 Copied from CRM [id number]. Topic: Clinical - Request for Lab/Test Order >> Sep 08, 2024 11:15 AM Mason Andersen wrote: Pt is wants to request orders be put in before his appt on Sep 15, 2024 to get his labs done before his appt

## 2024-09-09 ENCOUNTER — Other Ambulatory Visit: Payer: Self-pay

## 2024-09-09 DIAGNOSIS — R7303 Prediabetes: Secondary | ICD-10-CM

## 2024-09-09 DIAGNOSIS — E782 Mixed hyperlipidemia: Secondary | ICD-10-CM

## 2024-09-09 DIAGNOSIS — E039 Hypothyroidism, unspecified: Secondary | ICD-10-CM

## 2024-09-09 DIAGNOSIS — Z79899 Other long term (current) drug therapy: Secondary | ICD-10-CM

## 2024-09-09 DIAGNOSIS — I10 Essential (primary) hypertension: Secondary | ICD-10-CM

## 2024-09-09 NOTE — Telephone Encounter (Signed)
 Order the labs.

## 2024-09-12 DIAGNOSIS — E782 Mixed hyperlipidemia: Secondary | ICD-10-CM | POA: Diagnosis not present

## 2024-09-12 DIAGNOSIS — Z79899 Other long term (current) drug therapy: Secondary | ICD-10-CM | POA: Diagnosis not present

## 2024-09-12 DIAGNOSIS — E039 Hypothyroidism, unspecified: Secondary | ICD-10-CM | POA: Diagnosis not present

## 2024-09-12 DIAGNOSIS — R7303 Prediabetes: Secondary | ICD-10-CM | POA: Diagnosis not present

## 2024-09-14 ENCOUNTER — Ambulatory Visit: Payer: Self-pay | Admitting: Family Medicine

## 2024-09-14 LAB — MICROALBUMIN / CREATININE URINE RATIO
Creatinine, Urine: 170.4 mg/dL
Microalb/Creat Ratio: 4 mg/g{creat} (ref 0–29)
Microalbumin, Urine: 7.6 ug/mL

## 2024-09-14 LAB — CBC WITH DIFFERENTIAL/PLATELET
Basophils Absolute: 0 x10E3/uL (ref 0.0–0.2)
Basos: 1 %
EOS (ABSOLUTE): 0.3 x10E3/uL (ref 0.0–0.4)
Eos: 6 %
Hematocrit: 45 % (ref 37.5–51.0)
Hemoglobin: 14.6 g/dL (ref 13.0–17.7)
Immature Grans (Abs): 0 x10E3/uL (ref 0.0–0.1)
Immature Granulocytes: 0 %
Lymphocytes Absolute: 2.1 x10E3/uL (ref 0.7–3.1)
Lymphs: 38 %
MCH: 30.4 pg (ref 26.6–33.0)
MCHC: 32.4 g/dL (ref 31.5–35.7)
MCV: 94 fL (ref 79–97)
Monocytes Absolute: 0.4 x10E3/uL (ref 0.1–0.9)
Monocytes: 8 %
Neutrophils Absolute: 2.7 x10E3/uL (ref 1.4–7.0)
Neutrophils: 47 %
Platelets: 257 x10E3/uL (ref 150–450)
RBC: 4.81 x10E6/uL (ref 4.14–5.80)
RDW: 12.8 % (ref 11.6–15.4)
WBC: 5.7 x10E3/uL (ref 3.4–10.8)

## 2024-09-14 LAB — LIPID PANEL
Chol/HDL Ratio: 2.7 ratio (ref 0.0–5.0)
Cholesterol, Total: 143 mg/dL (ref 100–199)
HDL: 53 mg/dL (ref >39)
LDL Chol Calc (NIH): 70 mg/dL (ref 0–99)
Triglycerides: 108 mg/dL (ref 0–149)
VLDL Cholesterol Cal: 20 mg/dL (ref 5–40)

## 2024-09-14 LAB — COMPREHENSIVE METABOLIC PANEL WITH GFR
ALT: 23 IU/L (ref 0–44)
AST: 20 IU/L (ref 0–40)
Albumin: 4.3 g/dL (ref 3.9–4.9)
Alkaline Phosphatase: 70 IU/L (ref 47–123)
BUN/Creatinine Ratio: 13 (ref 10–24)
BUN: 12 mg/dL (ref 8–27)
Bilirubin Total: 0.5 mg/dL (ref 0.0–1.2)
CO2: 25 mmol/L (ref 20–29)
Calcium: 9.3 mg/dL (ref 8.6–10.2)
Chloride: 103 mmol/L (ref 96–106)
Creatinine, Ser: 0.95 mg/dL (ref 0.76–1.27)
Globulin, Total: 2.1 g/dL (ref 1.5–4.5)
Glucose: 96 mg/dL (ref 70–99)
Potassium: 4.8 mmol/L (ref 3.5–5.2)
Sodium: 139 mmol/L (ref 134–144)
Total Protein: 6.4 g/dL (ref 6.0–8.5)
eGFR: 91 mL/min/1.73 (ref >59)

## 2024-09-14 LAB — TSH: TSH: 2.58 u[IU]/mL (ref 0.450–4.500)

## 2024-09-14 LAB — HEMOGLOBIN A1C
Est. average glucose Bld gHb Est-mCnc: 120 mg/dL
Hgb A1c MFr Bld: 5.8 % — ABNORMAL HIGH (ref 4.8–5.6)

## 2024-09-15 ENCOUNTER — Ambulatory Visit: Admitting: Family Medicine

## 2024-09-15 ENCOUNTER — Encounter: Payer: Self-pay | Admitting: Family Medicine

## 2024-09-15 VITALS — BP 122/80 | Temp 97.9°F | Ht 71.0 in | Wt 225.0 lb

## 2024-09-15 DIAGNOSIS — Z0000 Encounter for general adult medical examination without abnormal findings: Secondary | ICD-10-CM | POA: Diagnosis not present

## 2024-09-15 NOTE — Patient Instructions (Signed)
Follow up annually. ? ?Take care ? ?Dr. Kivon Aprea  ?

## 2024-09-15 NOTE — Assessment & Plan Note (Signed)
 Doing well.  Labs at goal.  Preventative health care updated.  Follow-up annually.

## 2024-09-15 NOTE — Progress Notes (Signed)
 Subjective:  Patient ID: Mason Andersen, male    DOB: 01-18-1961  Age: 63 y.o. MRN: [id number]  CC:   Chief Complaint  Patient presents with   Annual Exam    Needs a copy of his lab results     HPI:  63 year old male presents for an annual exam.  Patient states that he is doing well.  He has no concerns at this time.  He requests a copy of his labs.  Patient's labs are well-controlled.  In regards to his preventative health care, he is due for pneumococcal vaccine, flu vaccine, and shingles vaccine.  He declines these today.  Colonoscopy up-to-date.  Patient Active Problem List   Diagnosis Date Noted   Annual physical exam 09/15/2024   Osteopenia 10/10/2021   Mixed hyperlipidemia 10/10/2021   Essential hypertension 01/04/2020   Hypothyroidism 11/16/2013    Social Hx   Social History   Socioeconomic History   Marital status: Single    Spouse name: Not on file   Number of children: Not on file   Years of education: Not on file   Highest education level: Not on file  Occupational History   Not on file  Tobacco Use   Smoking status: Never   Smokeless tobacco: Never  Vaping Use   Vaping status: Never Used  Substance and Sexual Activity   Alcohol use: Yes    Alcohol/week: 3.0 standard drinks of alcohol    Types: 3 Shots of liquor per week   Drug use: No   Sexual activity: Not on file  Other Topics Concern   Not on file  Social History Narrative   Not on file   Social Drivers of Health   Financial Resource Strain: Not on file  Food Insecurity: Not on file  Transportation Needs: Not on file  Physical Activity: Not on file  Stress: Not on file  Social Connections: Not on file    Review of Systems  Respiratory: Negative.    Cardiovascular: Negative.    Per HPI  Objective:  BP 122/80   Temp 97.9 F (36.6 C)   Ht 5' 11 (1.803 m)   Wt 225 lb (102.1 kg)   BMI 31.38 kg/m      09/15/2024    8:49 AM 03/14/2024    9:07 AM 03/14/2024    8:51 AM   BP/Weight  Systolic BP 122 129 150  Diastolic BP 80 82 98  Wt. (Lbs) 225  224  BMI 31.38 kg/m2  31.24 kg/m2    Physical Exam Vitals and nursing note reviewed.  Constitutional:      General: He is not in acute distress.    Appearance: Normal appearance.  HENT:     Head: Normocephalic and atraumatic.  Eyes:     General:        Right eye: No discharge.        Left eye: No discharge.     Conjunctiva/sclera: Conjunctivae normal.  Cardiovascular:     Rate and Rhythm: Normal rate and regular rhythm.  Pulmonary:     Effort: Pulmonary effort is normal.     Breath sounds: Normal breath sounds. No wheezing, rhonchi or rales.  Neurological:     Mental Status: He is alert.  Psychiatric:        Mood and Affect: Mood normal.        Behavior: Behavior normal.     Lab Results  Component Value Date   WBC 5.7 09/12/2024   HGB 14.6 09/12/2024  HCT 45.0 09/12/2024   PLT 257 09/12/2024   GLUCOSE 96 09/12/2024   CHOL 143 09/12/2024   TRIG 108 09/12/2024   HDL 53 09/12/2024   LDLCALC 70 09/12/2024   ALT 23 09/12/2024   AST 20 09/12/2024   NA 139 09/12/2024   K 4.8 09/12/2024   CL 103 09/12/2024   CREATININE 0.95 09/12/2024   BUN 12 09/12/2024   CO2 25 09/12/2024   TSH 2.580 09/12/2024   PSA 0.72 11/16/2014   HGBA1C 5.8 (H) 09/12/2024     Assessment & Plan:  Annual physical exam Assessment & Plan: Doing well.  Labs at goal.  Preventative health care updated.  Follow-up annually.    Follow-up: Annually  Yacob Wilkerson DO [facility]
